# Patient Record
Sex: Female | Born: 1990 | Race: Black or African American | Hispanic: No | Marital: Single | Smoking: Never smoker
Health system: Southern US, Community
[De-identification: ages and names within clinical notes are randomized; demographics above are authoritative.]

## PROBLEM LIST (undated history)

## (undated) DIAGNOSIS — D571 Sickle-cell disease without crisis: Secondary | ICD-10-CM

---

## 2010-05-07 ENCOUNTER — Ambulatory Visit (HOSPITAL_COMMUNITY)
Admission: RE | Admit: 2010-05-07 | Discharge: 2010-05-07 | Disposition: A | Discharge status: Home / Self Care | Payer: Medicaid Other | Source: Ambulatory Visit | Attending: Oral and Maxillofacial Surgery | Admitting: Oral and Maxillofacial Surgery

## 2010-05-07 DIAGNOSIS — D571 Sickle-cell disease without crisis: Secondary | ICD-10-CM | POA: Insufficient documentation

## 2010-05-07 DIAGNOSIS — K006 Disturbances in tooth eruption: Secondary | ICD-10-CM | POA: Insufficient documentation

## 2010-05-07 DIAGNOSIS — K029 Dental caries, unspecified: Secondary | ICD-10-CM | POA: Insufficient documentation

## 2010-05-07 DIAGNOSIS — K047 Periapical abscess without sinus: Secondary | ICD-10-CM | POA: Insufficient documentation

## 2010-05-07 DIAGNOSIS — Z01812 Encounter for preprocedural laboratory examination: Secondary | ICD-10-CM | POA: Insufficient documentation

## 2010-05-07 LAB — BASIC METABOLIC PANEL
CO2: 20 mEq/L (ref 19–32)
Calcium: 9.3 mg/dL (ref 8.4–10.5)
Chloride: 110 mEq/L (ref 96–112)
Creatinine, Ser: 0.87 mg/dL (ref 0.4–1.2)
Glucose, Bld: 97 mg/dL (ref 70–99)
Sodium: 139 mEq/L (ref 135–145)

## 2010-05-07 LAB — CBC
HCT: 28 % — ABNORMAL LOW (ref 36.0–46.0)
Platelets: 161 10*3/uL (ref 150–400)

## 2010-05-07 LAB — PROTIME-INR: Prothrombin Time: 14 seconds (ref 11.6–15.2)

## 2010-05-11 NOTE — Discharge Summary (Signed)
  Stacy [name], Stacy [name]               ACCOUNT NO.:  [id number]  MEDICAL RECORD NO.:  [id number]           PATIENT TYPE:  O  LOCATION:  [facility]                         FACILITY:  [facility]  PHYSICIAN:  Lincoln Brigham, DDSDATE OF BIRTH:  [birth year]  DATE OF ADMISSION:  05/07/2010 DATE OF DISCHARGE:  05/07/2010                              DISCHARGE SUMMARY   SURGEON:  Lincoln Brigham, DDS  HISTORY OF PRESENT ILLNESS:  Stacy [name] is a 20 year old African American female that was referred for Dr. Elisha Headland, for extraction of several teeth.  The patient has a medical history significant for sickle cell disease.  The patient's last crisis was 4 months prior at the time of childbirth.  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  periapical abscess #19 and nonfunctional soft tissue impacted #17 and 32.  PLAN:  The patient was taken to the operating room for extraction of # 1,2,3,5,14,16, 17,18, 19, 30, and 32 along with alveoloplasty in the upper right maxilla, left mandible and right mandible.  The patient tolerated the procedure well without complications.  At the conclusion of the surgery, the patient was deemed stable to be discharged home. The patient was discharged home with followup in the office for the following week.  At consultation appointment, the patient was prescribed Percocet 10/325 for pain control, she was given 30 tablets and the patient was instructed to take  tablet every 4 hours as needed for pain.          ______________________________ Lincoln Brigham, DDS     CD/MEDQ  D:  05/09/2010  T:  05/09/2010  Job:  [id number]  Electronically Signed by Lincoln Brigham DDS on 05/11/2010 03:26:27 PM

## 2010-05-11 NOTE — Op Note (Signed)
[name], [name]               ACCOUNT NO.:  [id number]  MEDICAL RECORD NO.:  [id number]           PATIENT TYPE:  O  LOCATION:  [facility]                         FACILITY:  [facility]  PHYSICIAN:  Lincoln Brigham, DDSDATE OF BIRTH:  [birth year]  DATE OF PROCEDURE:  05/07/2010 DATE OF DISCHARGE:  05/07/2010                              OPERATIVE REPORT   PREOPERATIVE DIAGNOSIS:  Sickle cell anemia, full bony impacted #1 and #16, carious teeth numbers 2, 3, 18, 19, root tips teeth numbers 5 and 14, periapical abscess #19, nonfunctional 17 and 32.  POSTOPERATIVE DIAGNOSIS:  Sickle cell anemia, full bony impacted teeth numbers 1 and 16, carious teeth numbers 2, 3, 18, 19, and 30, root tips teeth numbers 5 and 14, periapical abscess #19, and nonfunctional 17 and 32 which were soft tissue impacted.  SURGEON:  Lincoln Brigham, DDS  INDICATIONS FOR SURGERY:  Stacy [name] is a 20 year old African American female originally presenting to my office as a referral from Dr. Elisha Headland for extraction of several teeth.  The patient has a significant medical history of sickle cell anemia with her last crisis being 4 months prior, at the time a childbirth.  The patient was evaluated and deemed necessary to go to the operating room for management of her sickle cell anemia and extraction of necessary teeth including teeth numbers 1, 2, 3, 5, 14, 16, 17, 18, 19, 30, and 32, along with alveoloplasty of the bone in the right maxilla, left mandible, and right mandible.  PROCEDURE:  Extraction of #1, 2, 3, 5, 14, 16, 17, 18, 19, 30, and 32 along with alveoloplasty in right maxillary, left mandibular, and right mandibular quadrants.  TECHNIQUE:  The patient was evaluated in the preoperative area.  The patient's consent was verified and updated.  The patient's history and physical was also updated and verified.  The patient was then taken to the operating room by general anesthesia and placed on the bed in  the supine position.  The patient was then intubated with a nasal tube in the normal fashion by Anesthesia and then turned over to myself.  At this point, the patient was prepped and draped in the usual sterile fashion for oral surgery procedures.  After time-out was performed, a moistened Ray-Tec was then placed in the patient's mouth as a throat pack and then local anesthesia was then infiltrated into the regions in the upper right, upper left, lower left, and lower right maxilla and mandible.  Approximately 7.2 mL of 0.5% Marcaine with 1:200,000 epinephrine was then used along with 10.8 mL of 2% lidocaine with 1:100,000 epinephrine to anesthetize the maxilla and the mandible in the region of tooth extraction.  At this point, tongue retractor was placed and a [place] was used to retract cheek and 15 blade was used to make a full-thickness mucoperiosteal incision with distal buccal release in the region of #17 to #19.  Teeth numbers 17, 18, and 19 were elevated and then removed with dental forceps.  Next, a rongeur was then used to smooth the bone along with the bone file.  Copious irrigation was used and then the area  was closed with 3-0 chromic gut suture.  This was repeated for teeth numbers 1, 2, 3, 5, 14, 16, 30, and 32.  A 3-0 chromic gut suture was used to close the upper right, upper left, and lower right quadrants.  Hemostasis was achieved.  The patient's mouth was irrigated with copious amounts of normal saline at the conclusion of the case.  All retractors were removed along with the [facility] and Ray-Tec which served as a throat pack.  The patient was then turned over to the care of Anesthesia.  All counts were correct at the conclusion of the case.  The patient was extubated in a stable fashion and taken to the postoperative area where she recovered in a satisfactory fashion.  COMPLICATIONS:  None.  ESTIMATED BLOOD LOSS:  Minimal.  SPECIMENS:  Teeth were discarded and not  sent to Pathology.  The patient's condition following the surgery was stable.          ______________________________ Lincoln Brigham, DDS     CD/MEDQ  D:  05/09/2010  T:  05/09/2010  Job:  [id number]  Electronically Signed by Lincoln Brigham DDS on 05/11/2010 03:26:25 PM

## 2011-09-19 ENCOUNTER — Encounter (HOSPITAL_BASED_OUTPATIENT_CLINIC_OR_DEPARTMENT_OTHER): Payer: Self-pay | Admitting: *Deleted

## 2011-09-19 ENCOUNTER — Emergency Department (HOSPITAL_BASED_OUTPATIENT_CLINIC_OR_DEPARTMENT_OTHER)
Admission: EM | Admit: 2011-09-19 | Discharge: 2011-09-20 | Disposition: A | Discharge status: Home / Self Care | Payer: Medicaid Other | Attending: Emergency Medicine | Admitting: Emergency Medicine

## 2011-09-19 DIAGNOSIS — IMO0001 Reserved for inherently not codable concepts without codable children: Secondary | ICD-10-CM | POA: Insufficient documentation

## 2011-09-19 DIAGNOSIS — R070 Pain in throat: Secondary | ICD-10-CM | POA: Insufficient documentation

## 2011-09-19 DIAGNOSIS — J029 Acute pharyngitis, unspecified: Secondary | ICD-10-CM

## 2011-09-19 HISTORY — DX: Sickle-cell disease without crisis: D57.1

## 2011-09-19 NOTE — ED Notes (Signed)
Pt reports a sore throat and generalized body aches. Pt reports that she was given an RX for antibiotics yesterday day and has no relief as of today.  Throat does not appear red or swollen.  Pt denies any cough or fever.

## 2011-09-19 NOTE — ED Notes (Signed)
Pt has hx of sickle cell and has had generalized body aches and sore throat for 3 days. Seen at [facility] last p.m. "and they didn't do anything"

## 2011-09-20 MED ORDER — NAPROXEN 250 MG PO TABS
500.0000 mg | ORAL_TABLET | Freq: Once | ORAL | Status: AC
  Administered 2011-09-20: 500 mg via ORAL
  Filled 2011-09-20: qty 2

## 2011-09-20 MED ORDER — HYDROCODONE-ACETAMINOPHEN 7.5-500 MG/15ML PO SOLN
10.0000 mL | Freq: Once | ORAL | Status: AC
  Administered 2011-09-20: 10 mL via ORAL
  Filled 2011-09-20: qty 15

## 2011-09-20 MED ORDER — HYDROCODONE-ACETAMINOPHEN 7.5-500 MG/15ML PO SOLN: 10.0000 mL | ORAL | Status: AC | PRN

## 2011-09-20 MED ORDER — DEXAMETHASONE 10 MG/ML FOR PEDIATRIC ORAL USE
10.0000 mg | Freq: Once | INTRAMUSCULAR | Status: DC
  Filled 2011-09-20: qty 1

## 2011-09-20 NOTE — Discharge Instructions (Signed)

## 2011-09-20 NOTE — ED Provider Notes (Signed)
History   This chart was scribed for Stacy Seamen, MD by Charolett Bumpers . The patient was seen in room MH04/MH04.    CSN: [id number]  Arrival date & time 09/19/11  2315   First MD Initiated Contact with Patient 09/20/11 0002      Chief Complaint  Patient presents with  . Generalized Body Aches    (Consider location/radiation/quality/duration/timing/severity/associated sxs/prior treatment) HPI Stacy [name] is a 21 y.o. female who presents to the Emergency Department complaining of constant, moderate generalized body aches with associated sore throat for the past 3 days. Patient rates her body aches as a 10/10 and her sore throat pain an 8/10. Patient also reports associated trouble swallowing. Patient denies any fevers, n/v, or urinary symptoms. Patient was seen at [facility] last night and started on Amoxicillin. Patient report no improvement in her symptoms.     Past Medical History  Diagnosis Date  . Sickle cell anemia     Past Surgical History  Procedure Date  . Cesarean section     History reviewed. No pertinent family history.  History  Substance Use Topics  . Smoking status: Never Smoker   . Smokeless tobacco: Not on file  . Alcohol Use: No    OB History    Grav Para Term Preterm Abortions TAB SAB Ect Mult Living                  Review of Systems  Constitutional: Negative for fever.  HENT: Positive for sore throat and trouble swallowing.   Gastrointestinal: Negative for nausea and vomiting.  Genitourinary: Negative for dysuria.  Musculoskeletal: Positive for myalgias.  All other systems reviewed and are negative.    Allergies  Review of patient's allergies indicates not on file.  Home Medications  No current outpatient prescriptions on file.  BP 123/80  Pulse 74  Temp 98.3 F (36.8 C) (Oral)  Resp 20  Ht 5' (1.524 m)  Wt 165 lb (74.844 kg)  BMI 32.22 kg/m2  SpO2 99%  Physical Exam  Nursing note and vitals  reviewed. Constitutional: She is oriented to person, place, and time. She appears well-developed and well-nourished. No distress.  HENT:  Head: Normocephalic and atraumatic.  Mouth/Throat: No oropharyngeal exudate.       No erythema or exudate of oropharynx.   Eyes: Conjunctivae and EOM are normal. Pupils are equal, round, and reactive to light.  Neck: Neck supple. No tracheal deviation present.       Anterior cervical adenopathy.   Cardiovascular: Normal rate, regular rhythm, normal heart sounds and intact distal pulses.        Good pulses.   Pulmonary/Chest: Effort normal and breath sounds normal. No respiratory distress. She has no wheezes.  Abdominal: Soft. Bowel sounds are normal. She exhibits no distension. There is no hepatosplenomegaly. There is no tenderness.  Musculoskeletal: Normal range of motion. She exhibits no edema.  Lymphadenopathy:    She has cervical adenopathy.  Neurological: She is alert and oriented to person, place, and time. No sensory deficit.  Skin: Skin is warm and dry.  Psychiatric: She has a normal mood and affect. Her behavior is normal.    ED Course  Procedures (including critical care time)  DIAGNOSTIC STUDIES: Oxygen Saturation is 99% on room air, normal by my interpretation.    COORDINATION OF CARE:  0006: Discussed planned course of treatment with the patient, who is agreeable at this time.  0015: Medication Orders: Hydrocodone-acetaminophen (Lortab) 7.5-500 mg/15  mL solution 10 mL-once; Naproxen (Naproxyn) tablet 500 mg-once.      MDM   Nursing notes and vitals signs, including pulse oximetry, reviewed.  Summary of this visit's results, reviewed by myself:  Labs:  Results for orders placed during the hospital encounter of 09/19/11  RAPID STREP SCREEN      Component Value Range   Streptococcus, Group A Screen (Direct) NEGATIVE  NEGATIVE     I personally performed the services described in this documentation, which was scribed in my  presence.  The recorded information has been reviewed and considered.       Stacy Seamen, MD 09/20/11 [phone]

## 2011-11-21 ENCOUNTER — Emergency Department (HOSPITAL_BASED_OUTPATIENT_CLINIC_OR_DEPARTMENT_OTHER)
Admission: EM | Admit: 2011-11-21 | Discharge: 2011-11-22 | Disposition: A | Discharge status: Home / Self Care | Payer: Medicaid Other | Attending: Emergency Medicine | Admitting: Emergency Medicine

## 2011-11-21 ENCOUNTER — Emergency Department (HOSPITAL_BASED_OUTPATIENT_CLINIC_OR_DEPARTMENT_OTHER): Payer: Medicaid Other

## 2011-11-21 ENCOUNTER — Encounter (HOSPITAL_BASED_OUTPATIENT_CLINIC_OR_DEPARTMENT_OTHER): Payer: Self-pay | Admitting: *Deleted

## 2011-11-21 DIAGNOSIS — S93409A Sprain of unspecified ligament of unspecified ankle, initial encounter: Secondary | ICD-10-CM | POA: Insufficient documentation

## 2011-11-21 DIAGNOSIS — M25569 Pain in unspecified knee: Secondary | ICD-10-CM | POA: Insufficient documentation

## 2011-11-21 DIAGNOSIS — Y92009 Unspecified place in unspecified non-institutional (private) residence as the place of occurrence of the external cause: Secondary | ICD-10-CM | POA: Insufficient documentation

## 2011-11-21 DIAGNOSIS — W010XXA Fall on same level from slipping, tripping and stumbling without subsequent striking against object, initial encounter: Secondary | ICD-10-CM | POA: Insufficient documentation

## 2011-11-21 NOTE — ED Notes (Signed)
Pt states she slipped in the tub and injured her right leg and ankle. C/O pain to same

## 2011-11-22 ENCOUNTER — Emergency Department (HOSPITAL_BASED_OUTPATIENT_CLINIC_OR_DEPARTMENT_OTHER): Payer: Medicaid Other

## 2011-11-22 MED ORDER — IBUPROFEN 600 MG PO TABS: 600.0000 mg | ORAL_TABLET | Freq: Three times a day (TID) | ORAL | Status: AC | PRN

## 2011-11-22 MED ORDER — IBUPROFEN 400 MG PO TABS
600.0000 mg | ORAL_TABLET | Freq: Once | ORAL | Status: AC
  Administered 2011-11-22: 600 mg via ORAL
  Filled 2011-11-22: qty 1

## 2011-11-22 NOTE — ED Provider Notes (Signed)
History    CSN: [id number] Arrival date & time 11/21/11  2314 First MD Initiated Contact with Patient 11/21/11 2356      Chief Complaint  Patient presents with  . Leg Pain   Patient is a 21 y.o. female presenting with leg pain.  Leg Pain   Patient presents emergent complaints of a right leg injury.  Patient states she slipped in the and injured her right lower leg. She now has pain from her knee down to her ankle. Pain is mild in nature but increases when she walks or tries to move her foot and ankle. She denies any numbness or weakness. She denies any loss of consciousness or any other injury Past Medical History  Diagnosis Date  . Sickle cell anemia     Past Surgical History  Procedure Date  . Cesarean section     History reviewed. No pertinent family history.  History  Substance Use Topics  . Smoking status: Never Smoker   . Smokeless tobacco: Not on file  . Alcohol Use: No    OB History    Grav Para Term Preterm Abortions TAB SAB Ect Mult Living                  Review of Systems  All other systems reviewed and are negative.    Allergies  Demerol  Home Medications   Current Outpatient Rx  Name Route Sig Dispense Refill  . AMOXICILLIN 500 MG PO CAPS Oral Take 500 mg by mouth 3 (three) times daily.      BP 135/84  Pulse 72  Temp 98.9 F (37.2 C) (Oral)  Resp 20  Ht 5' (1.524 m)  Wt 165 lb (74.844 kg)  BMI 32.22 kg/m2  SpO2 98%  Physical Exam  Nursing note and vitals reviewed. Constitutional: She appears well-developed and well-nourished. No distress.  HENT:  Head: Normocephalic and atraumatic.  Right Ear: External ear normal.  Left Ear: External ear normal.  Eyes: Conjunctivae are normal. Right eye exhibits no discharge. Left eye exhibits no discharge. No scleral icterus.  Neck: Neck supple. No tracheal deviation present.  Cardiovascular: Normal rate.   Pulmonary/Chest: Effort normal. No stridor. No respiratory distress.  Musculoskeletal:  She exhibits tenderness. She exhibits no edema.       Right knee: She exhibits no swelling and no effusion.       Left knee: She exhibits no swelling and no effusion.       Right lower leg: She exhibits tenderness. She exhibits no swelling, no edema and no deformity.       Right foot: She exhibits tenderness. She exhibits normal range of motion, no bony tenderness, no swelling and normal capillary refill.  Neurological: She is alert. Cranial nerve deficit: no gross deficits.  Skin: Skin is warm and dry. No rash noted.  Psychiatric: She has a normal mood and affect.    ED Course  Procedures (including critical care time) Pt is on depo.  Does not think she could be pregnant.  Labs Reviewed - No data to display Dg Tibia/fibula Right  11/22/2011  *RADIOLOGY REPORT*  Clinical Data: Fall, pain.  RIGHT TIBIA AND FIBULA - 2 VIEW  Comparison: None.  Findings: Imaged bones, joints and soft tissues appear normal.  IMPRESSION: Negative exam.   Original Report Authenticated By: Bernadene Bell. D'ALESSIO, M.D.    Dg Ankle Complete Right  11/22/2011  *RADIOLOGY REPORT*  Clinical Data: Fall, pain.  RIGHT ANKLE - COMPLETE 3+ VIEW  Comparison: None.  Findings: Imaged bones, joints and soft tissues appear normal.  IMPRESSION: Negative exam.   Original Report Authenticated By: Bernadene Bell. D'ALESSIO, M.D.    Dg Knee Complete 4 Views Right  11/22/2011  *RADIOLOGY REPORT*  Clinical Data: Fall.  Knee pain.  RIGHT KNEE - COMPLETE 4+ VIEW  Comparison: None.  Findings: Imaged bones, joints and soft tissues appear normal.  IMPRESSION: Negative exam.   Original Report Authenticated By: Bernadene Bell. D'ALESSIO, M.D.      1. Ankle sprain       MDM  At this time there does not appear to be any evidence of an acute emergency medical condition and the patient appears stable for discharge with appropriate outpatient follow up.         Celene Kras, MD 11/22/11 [phone]

## 2011-11-22 NOTE — ED Notes (Signed)
Returned from xray

## 2011-11-22 NOTE — ED Notes (Addendum)
Assisted pt to her car.

## 2011-11-22 NOTE — ED Notes (Signed)
Patient transported to X-ray 

## 2012-02-11 ENCOUNTER — Emergency Department (HOSPITAL_BASED_OUTPATIENT_CLINIC_OR_DEPARTMENT_OTHER)
Admission: EM | Admit: 2012-02-11 | Discharge: 2012-02-12 | Disposition: A | Discharge status: Home / Self Care | Payer: No Typology Code available for payment source | Attending: Emergency Medicine | Admitting: Emergency Medicine

## 2012-02-11 ENCOUNTER — Encounter (HOSPITAL_BASED_OUTPATIENT_CLINIC_OR_DEPARTMENT_OTHER): Payer: Self-pay

## 2012-02-11 DIAGNOSIS — Y929 Unspecified place or not applicable: Secondary | ICD-10-CM | POA: Insufficient documentation

## 2012-02-11 DIAGNOSIS — D571 Sickle-cell disease without crisis: Secondary | ICD-10-CM | POA: Insufficient documentation

## 2012-02-11 DIAGNOSIS — Y939 Activity, unspecified: Secondary | ICD-10-CM | POA: Insufficient documentation

## 2012-02-11 DIAGNOSIS — S139XXA Sprain of joints and ligaments of unspecified parts of neck, initial encounter: Secondary | ICD-10-CM | POA: Insufficient documentation

## 2012-02-11 DIAGNOSIS — S161XXA Strain of muscle, fascia and tendon at neck level, initial encounter: Secondary | ICD-10-CM
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
## 2012-02-12 ENCOUNTER — Emergency Department (HOSPITAL_BASED_OUTPATIENT_CLINIC_OR_DEPARTMENT_OTHER): Payer: No Typology Code available for payment source

## 2012-02-12 MED ORDER — HYDROCODONE-ACETAMINOPHEN 5-325 MG PO TABS: 1.0000 | ORAL_TABLET | Freq: Four times a day (QID) | ORAL | Status: DC | PRN

## 2012-02-12 NOTE — ED Notes (Signed)
Patient transported to X-ray 

## 2012-02-12 NOTE — ED Notes (Signed)
Patient transported to CT 

## 2012-06-29 ENCOUNTER — Emergency Department (HOSPITAL_BASED_OUTPATIENT_CLINIC_OR_DEPARTMENT_OTHER)
Admission: EM | Admit: 2012-06-29 | Discharge: 2012-06-29 | Disposition: A | Discharge status: Home / Self Care | Payer: Medicaid Other | Attending: Emergency Medicine | Admitting: Emergency Medicine

## 2012-06-29 ENCOUNTER — Encounter (HOSPITAL_BASED_OUTPATIENT_CLINIC_OR_DEPARTMENT_OTHER): Payer: Self-pay | Admitting: Emergency Medicine

## 2012-06-29 DIAGNOSIS — N390 Urinary tract infection, site not specified: Secondary | ICD-10-CM

## 2012-06-29 DIAGNOSIS — Z862 Personal history of diseases of the blood and blood-forming organs and certain disorders involving the immune mechanism: Secondary | ICD-10-CM | POA: Insufficient documentation

## 2012-06-29 DIAGNOSIS — M545 Low back pain, unspecified: Secondary | ICD-10-CM | POA: Insufficient documentation

## 2012-06-29 DIAGNOSIS — Z3202 Encounter for pregnancy test, result negative: Secondary | ICD-10-CM | POA: Insufficient documentation

## 2012-06-29 LAB — PREGNANCY, URINE: Preg Test, Ur: NEGATIVE

## 2012-06-29 LAB — URINE MICROSCOPIC-ADD ON

## 2012-06-29 LAB — URINALYSIS, ROUTINE W REFLEX MICROSCOPIC
Bilirubin Urine: NEGATIVE
Glucose, UA: NEGATIVE mg/dL
Specific Gravity, Urine: 1.013 (ref 1.005–1.030)
Urobilinogen, UA: 0.2 mg/dL (ref 0.0–1.0)

## 2012-06-29 MED ORDER — HYDROCODONE-ACETAMINOPHEN 5-325 MG PO TABS: 2.0000 | ORAL_TABLET | ORAL | Status: DC | PRN

## 2012-06-29 MED ORDER — CEPHALEXIN 500 MG PO CAPS: 500.0000 mg | ORAL_CAPSULE | Freq: Four times a day (QID) | ORAL | Status: DC

## 2012-06-29 NOTE — ED Notes (Signed)
MD at bedside. 

## 2012-06-29 NOTE — ED Provider Notes (Signed)
History     CSN: [id number]  Arrival date & time 06/29/12  1018   First MD Initiated Contact with Patient 06/29/12 1210      Chief Complaint  Patient presents with  . Abdominal Pain  . Back Pain    (Consider location/radiation/quality/duration/timing/severity/associated sxs/prior treatment) Patient is a 22 y.o. female presenting with abdominal pain and back pain. The history is provided by the patient. No language interpreter was used.  Abdominal Pain Pain location:  Generalized Pain quality: aching   Pain radiates to:  Back Pain severity:  Moderate Onset quality:  Gradual Duration:  1 day Timing:  Constant Progression:  Worsening Chronicity:  New Relieved by:  Nothing Worsened by:  Nothing tried Risk factors: no alcohol abuse   Back Pain Associated symptoms: abdominal pain   Pt complains of soreness in her low back and lower abdominal soreness.  No vomiting of diarrhea.  No fever  Past Medical History  Diagnosis Date  . Sickle cell anemia     Past Surgical History  Procedure Laterality Date  . Cesarean section      No family history on file.  History  Substance Use Topics  . Smoking status: Never Smoker   . Smokeless tobacco: Not on file  . Alcohol Use: No    OB History   Grav Para Term Preterm Abortions TAB SAB Ect Mult Living                  Review of Systems  Gastrointestinal: Positive for abdominal pain.  Musculoskeletal: Positive for back pain.  All other systems reviewed and are negative.    Allergies  Demerol  Home Medications   Current Outpatient Rx  Name  Route  Sig  Dispense  Refill  . cephALEXin (KEFLEX) 500 MG capsule   Oral   Take 1 capsule (500 mg total) by mouth 4 (four) times daily.   40 capsule   0   . HYDROcodone-acetaminophen (NORCO/VICODIN) 5-325 MG per tablet   Oral   Take 1-2 tablets by mouth every 6 (six) hours as needed for pain.   20 tablet   0   . HYDROcodone-acetaminophen (NORCO/VICODIN) 5-325 MG per  tablet   Oral   Take 2 tablets by mouth every 4 (four) hours as needed for pain.   10 tablet   0     BP 138/94  Pulse 106  Temp(Src) 98.6 F (37 C) (Oral)  Resp 16  Ht 5' (1.524 m)  Wt 160 lb (72.576 kg)  BMI 31.25 kg/m2  SpO2 97%  LMP 06/05/2012  Physical Exam  Nursing note and vitals reviewed. Constitutional: She is oriented to person, place, and time. She appears well-developed and well-nourished.  HENT:  Head: Normocephalic.  Right Ear: External ear normal.  Mouth/Throat: Oropharynx is clear and moist.  Eyes: Conjunctivae are normal. Pupils are equal, round, and reactive to light.  Neck: Normal range of motion. Neck supple.  Cardiovascular: Normal rate and normal heart sounds.   Pulmonary/Chest: Effort normal.  Abdominal: Soft.  Musculoskeletal: Normal range of motion.  Neurological: She is alert and oriented to person, place, and time. She has normal reflexes.  Skin: Skin is warm.  Psychiatric: She has a normal mood and affect.    ED Course  Procedures (including critical care time)  Labs Reviewed  URINALYSIS, ROUTINE W REFLEX MICROSCOPIC - Abnormal; Notable for the following:    APPearance TURBID (*)    Hgb urine dipstick LARGE (*)    Protein, ur  100 (*)    Nitrite POSITIVE (*)    Leukocytes, UA LARGE (*)    All other components within normal limits  URINE MICROSCOPIC-ADD ON - Abnormal; Notable for the following:    Bacteria, UA MANY (*)    All other components within normal limits  URINE CULTURE  PREGNANCY, URINE   No results found.   1. UTI (lower urinary tract infection)       MDM  Pt given rx for keflex and hydrocodone.   Pt advised to return if any problems.       Lonia Skinner [place], PA-C 06/29/12 1415  Elson Areas, PA-C 06/29/12 1415

## 2012-06-29 NOTE — ED Notes (Signed)
C/o abd pain and low back pain since last night.  N/V x one last night.  No diarrhea.  No fever. Has taken IBU without relief.  Last dose last night.

## 2012-06-29 NOTE — ED Provider Notes (Signed)
Medical screening examination/treatment/procedure(s) were performed by non-physician practitioner and as supervising physician I was immediately available for consultation/collaboration.   Carleene Cooper III, MD 06/29/12 2037

## 2012-07-01 LAB — URINE CULTURE: Colony Count: >=100000

## 2012-07-02 ENCOUNTER — Telehealth (HOSPITAL_COMMUNITY): Payer: Self-pay | Admitting: Emergency Medicine

## 2012-07-02 NOTE — ED Notes (Signed)
Patient has +Urine culture. Checking to see if appropriately treated. °

## 2012-07-02 NOTE — ED Notes (Signed)
+  Urine. Patient treated with Keflex. Sensitive to same. Per protocol MD. °

## 2013-01-02 ENCOUNTER — Emergency Department (HOSPITAL_BASED_OUTPATIENT_CLINIC_OR_DEPARTMENT_OTHER)
Admission: EM | Admit: 2013-01-02 | Discharge: 2013-01-02 | Disposition: A | Discharge status: Home / Self Care | Payer: Medicaid Other | Attending: Emergency Medicine | Admitting: Emergency Medicine

## 2013-01-02 ENCOUNTER — Encounter (HOSPITAL_BASED_OUTPATIENT_CLINIC_OR_DEPARTMENT_OTHER): Payer: Self-pay | Admitting: *Deleted

## 2013-01-02 ENCOUNTER — Emergency Department (HOSPITAL_BASED_OUTPATIENT_CLINIC_OR_DEPARTMENT_OTHER): Payer: Medicaid Other

## 2013-01-02 DIAGNOSIS — Z792 Long term (current) use of antibiotics: Secondary | ICD-10-CM | POA: Insufficient documentation

## 2013-01-02 DIAGNOSIS — IMO0001 Reserved for inherently not codable concepts without codable children: Secondary | ICD-10-CM | POA: Insufficient documentation

## 2013-01-02 DIAGNOSIS — J029 Acute pharyngitis, unspecified: Secondary | ICD-10-CM | POA: Insufficient documentation

## 2013-01-02 DIAGNOSIS — Z3202 Encounter for pregnancy test, result negative: Secondary | ICD-10-CM | POA: Insufficient documentation

## 2013-01-02 DIAGNOSIS — R599 Enlarged lymph nodes, unspecified: Secondary | ICD-10-CM | POA: Insufficient documentation

## 2013-01-02 DIAGNOSIS — D5700 Hb-SS disease with crisis, unspecified: Secondary | ICD-10-CM

## 2013-01-02 LAB — CBC WITH DIFFERENTIAL/PLATELET
Basophils Absolute: 0 10*3/uL (ref 0.0–0.1)
Eosinophils Absolute: 0.1 10*3/uL (ref 0.0–0.7)
HCT: 31.2 % — ABNORMAL LOW (ref 36.0–46.0)
Lymphocytes Relative: 16 % (ref 12–46)
MCHC: 37.2 g/dL — ABNORMAL HIGH (ref 30.0–36.0)
Monocytes Absolute: 1.1 10*3/uL — ABNORMAL HIGH (ref 0.1–1.0)
Monocytes Relative: 8 % (ref 3–12)
Neutro Abs: 10 10*3/uL — ABNORMAL HIGH (ref 1.7–7.7)
Neutrophils Relative %: 75 % (ref 43–77)
Platelets: 255 10*3/uL (ref 150–400)
RDW: 16 % — ABNORMAL HIGH (ref 11.5–15.5)

## 2013-01-02 LAB — BASIC METABOLIC PANEL
BUN: 10 mg/dL (ref 6–23)
CO2: 23 mEq/L (ref 19–32)
Chloride: 102 mEq/L (ref 96–112)
Creatinine, Ser: 0.8 mg/dL (ref 0.50–1.10)
GFR calc Af Amer: >90 mL/min (ref >90)
GFR calc non Af Amer: >90 mL/min (ref >90)
Potassium: 3.9 mEq/L (ref 3.5–5.1)

## 2013-01-02 LAB — URINALYSIS, ROUTINE W REFLEX MICROSCOPIC
Bilirubin Urine: NEGATIVE
Hgb urine dipstick: NEGATIVE
Ketones, ur: NEGATIVE mg/dL
Leukocytes, UA: NEGATIVE
Nitrite: NEGATIVE
Urobilinogen, UA: 0.2 mg/dL (ref 0.0–1.0)
pH: 7 (ref 5.0–8.0)

## 2013-01-02 LAB — RETICULOCYTES: RBC.: 4.39 MIL/uL (ref 3.87–5.11)

## 2013-01-02 LAB — RAPID STREP SCREEN (MED CTR MEBANE ONLY): Streptococcus, Group A Screen (Direct): NEGATIVE

## 2013-01-02 MED ORDER — HYDROCODONE-ACETAMINOPHEN 5-325 MG PO TABS: 2.0000 | ORAL_TABLET | Freq: Four times a day (QID) | ORAL | Status: DC | PRN

## 2013-01-02 MED ORDER — PENICILLIN G BENZATHINE 1200000 UNIT/2ML IM SUSP
1.2000 10*6.[IU] | Freq: Once | INTRAMUSCULAR | Status: AC
  Administered 2013-01-02: 1.2 10*6.[IU] via INTRAMUSCULAR
  Filled 2013-01-02: qty 2

## 2013-01-02 MED ORDER — SODIUM CHLORIDE 0.9 % IV BOLUS (SEPSIS)
1000.0000 mL | Freq: Once | INTRAVENOUS | Status: AC
  Administered 2013-01-02: 1000 mL via INTRAVENOUS

## 2013-01-02 MED ORDER — HYDROMORPHONE HCL PF 1 MG/ML IJ SOLN
1.0000 mg | Freq: Once | INTRAMUSCULAR | Status: AC
  Administered 2013-01-02: 1 mg via INTRAVENOUS
  Filled 2013-01-02: qty 1

## 2013-01-02 MED ORDER — IOHEXOL 300 MG/ML  SOLN
75.0000 mL | Freq: Once | INTRAMUSCULAR | Status: AC | PRN
  Administered 2013-01-02: 75 mL via INTRAVENOUS

## 2013-01-02 MED ORDER — DEXAMETHASONE SODIUM PHOSPHATE 10 MG/ML IJ SOLN
10.0000 mg | Freq: Once | INTRAMUSCULAR | Status: AC
  Administered 2013-01-02: 10 mg via INTRAVENOUS
  Filled 2013-01-02: qty 1

## 2013-01-02 MED ORDER — OXYCODONE-ACETAMINOPHEN 5-325 MG PO TABS
2.0000 | ORAL_TABLET | Freq: Once | ORAL | Status: AC
  Administered 2013-01-02: 2 via ORAL
  Filled 2013-01-02: qty 2

## 2013-01-02 NOTE — ED Notes (Signed)
MD at bedside. 

## 2013-01-02 NOTE — ED Notes (Signed)
Pt c/o sore throat and body aches x 3 days

## 2013-01-02 NOTE — ED Provider Notes (Signed)
CSN: [id number]     Arrival date & time 01/02/13  1739 History  This chart was scribed for Orest Dygert B. Bernette Mayers, MD by Dorothey Baseman, ED Scribe. This patient was seen in room MH05/MH05 and the patient's care was started at 8:05 PM.    Chief Complaint  Patient presents with  . Sore Throat  . Generalized Body Aches   The history is provided by the patient. No language interpreter was used.   HPI Comments: Stacy [name] is a 22 y.o. female who presents to the Emergency Department complaining of sore throat with associated myalgias onset 3 days ago. Patient reports that her myalgias feel similar to her sickle cell anemia pain. She reports taking ibuprofen at home with mild, temporary relief. Patient states that she has not followed up with anyone for her sickle cell recently. She denies cough, rhinorrhea, ear pain, fever, or trouble swallowing. Patient denies any sick contacts.   Past Medical History  Diagnosis Date  . Sickle cell anemia    Past Surgical History  Procedure Laterality Date  . Cesarean section     History reviewed. No pertinent family history. History  Substance Use Topics  . Smoking status: Never Smoker   . Smokeless tobacco: Not on file  . Alcohol Use: No   OB History   Grav Para Term Preterm Abortions TAB SAB Ect Mult Living                 Review of Systems  A complete 10 system review of systems was obtained and all systems are negative except as noted in the HPI and PMH.   Allergies  Demerol  Home Medications   Current Outpatient Rx  Name  Route  Sig  Dispense  Refill  . cephALEXin (KEFLEX) 500 MG capsule   Oral   Take 1 capsule (500 mg total) by mouth 4 (four) times daily.   40 capsule   0   . HYDROcodone-acetaminophen (NORCO/VICODIN) 5-325 MG per tablet   Oral   Take 1-2 tablets by mouth every 6 (six) hours as needed for pain.   20 tablet   0   . HYDROcodone-acetaminophen (NORCO/VICODIN) 5-325 MG per tablet   Oral   Take 2 tablets by mouth  every 4 (four) hours as needed for pain.   10 tablet   0    Triage Vitals: BP 117/72  Pulse 109  Temp(Src) 99.1 F (37.3 C) (Oral)  Resp 16  Ht 5' (1.524 m)  Wt 160 lb (72.576 kg)  BMI 31.25 kg/m2  SpO2 99%  LMP 12/26/2012  Physical Exam  Constitutional: She is oriented to person, place, and time. She appears well-developed and well-nourished.  HENT:  Head: Normocephalic and atraumatic.  Mouth/Throat: Oropharynx is clear and moist.  Right-sided tonsillar swelling 2+ includes tonsillar pillar.   Neck: Neck supple.  Pulmonary/Chest: Effort normal.  Abdominal: Soft. There is no tenderness.  Lymphadenopathy:    She has cervical adenopathy.  Neurological: She is alert and oriented to person, place, and time. No cranial nerve deficit.  Psychiatric: She has a normal mood and affect. Her behavior is normal.    ED Course  Procedures (including critical care time)  Medications  sodium chloride 0.9 % bolus 1,000 mL (0 mLs Intravenous Stopped 01/02/13 2134)  dexamethasone (DECADRON) injection 10 mg (10 mg Intravenous Given 01/02/13 2028)  iohexol (OMNIPAQUE) 300 MG/ML solution 75 mL (75 mLs Intravenous Contrast Given 01/02/13 2046)  HYDROmorphone (DILAUDID) injection 1 mg (1 mg Intravenous Given  01/02/13 2135)  penicillin g benzathine (BICILLIN LA) 1200000 UNIT/2ML injection 1.2 Million Units (1.2 Million Units Intramuscular Given 01/02/13 2139)    DIAGNOSTIC STUDIES: Oxygen Saturation is 99% on room air, normal by my interpretation.    COORDINATION OF CARE: 8:07PM- Discussed negative strep test. Will order IV fluids, antibiotics, and a CT of the neck. Discussed treatment plan with patient at bedside and patient verbalized agreement.     Labs Review Labs Reviewed  CBC WITH DIFFERENTIAL - Abnormal; Notable for the following:    WBC 13.3 (*)    Hemoglobin 11.6 (*)    HCT 31.2 (*)    MCV 72.2 (*)    MCHC 37.2 (*)    RDW 16.0 (*)    Neutro Abs 10.0 (*)    Monocytes Absolute 1.1  (*)    All other components within normal limits  BASIC METABOLIC PANEL - Abnormal; Notable for the following:    Glucose, Bld 101 (*)    All other components within normal limits  RETICULOCYTES - Abnormal; Notable for the following:    Retic Ct Pct 3.3 (*)    All other components within normal limits  RAPID STREP SCREEN  CULTURE, GROUP A STREP  URINALYSIS, ROUTINE W REFLEX MICROSCOPIC  PREGNANCY, URINE   Imaging Review Ct Soft Tissue Neck W Contrast   01/02/2013   CLINICAL DATA:  Sore throat and fever for 3 days. Rule out right peritonsillar abscess.  EXAM: CT NECK WITH CONTRAST  TECHNIQUE: Multidetector CT imaging of the neck was performed using the standard protocol following the bolus administration of intravenous contrast.  CONTRAST:  75mL OMNIPAQUE IOHEXOL 300 MG/ML  SOLN  COMPARISON:  02/12/2012 cervical spine CT.  FINDINGS: Enlarged inflamed palatine tonsils greater on the right. Presently, no well-defined drainable abscess or breakthrough into the parapharyngeal space.  Bilateral lymph nodes throughout the neck largest right level 2 region measures up to 2.2 x 2 cm transverse dimension. Lymph nodes are most likely reactive in origin given the clinical setting.  Appearing inferior to the left lobe of thyroid gland is a 2.3 x 2.3 x 1.5 cm low-density lesion. It is possible this represents a thymic cyst given the location and low density. For further delineation, contrast enhanced MRI of the neck on elective basis can be performed.  Reversal of the normal cervical lordosis.  Polypoid opacification the right maxillary sinus.  IMPRESSION: Enlarged inflamed palatine tonsils greater on the right. Presently, no well-defined drainable abscess or breakthrough into the parapharyngeal space.  Bilateral lymph nodes throughout the neck largest right level 2 region measures up to 2.2 x 2 cm transverse dimension. Lymph nodes are most likely reactive in origin given the clinical setting.  Appearing inferior  to the left lobe of thyroid gland is a 2.3 x 2.3 x 1.5 cm low-density lesion. It is possible this represents a thymic cyst given the location and low density. For further delineation, contrast enhanced MRI of the neck on elective basis can be performed.  Polypoid opacification the right maxillary sinus.   Electronically Signed   By: Bridgett Larsson M.D.   On: 01/02/2013 21:01    MDM   1. Pharyngitis   2. Sickle cell anemia with pain     Labs and imaging reviewed. Concern for pharyngitis, but no signs of abscess. She has low grade temp but no fever here. Feeling better after decadron, pain meds and IVF. Requesting one additional dose of pain medication, but otherwise feels ready to go home. Given PCN  IM for pharyngitis.  I personally performed the services described in this documentation, which was scribed in my presence. The recorded information has been reviewed and is accurate.      Jameis Newsham B. Bernette Mayers, MD 01/02/13 2223

## 2013-01-04 LAB — CULTURE, GROUP A STREP

## 2013-04-11 ENCOUNTER — Encounter (HOSPITAL_BASED_OUTPATIENT_CLINIC_OR_DEPARTMENT_OTHER): Payer: Self-pay | Admitting: Emergency Medicine

## 2013-04-11 ENCOUNTER — Emergency Department (HOSPITAL_BASED_OUTPATIENT_CLINIC_OR_DEPARTMENT_OTHER): Payer: Medicaid Other

## 2013-04-11 ENCOUNTER — Emergency Department (HOSPITAL_BASED_OUTPATIENT_CLINIC_OR_DEPARTMENT_OTHER)
Admission: EM | Admit: 2013-04-11 | Discharge: 2013-04-12 | Disposition: A | Discharge status: Home / Self Care | Payer: Medicaid Other | Attending: Emergency Medicine | Admitting: Emergency Medicine

## 2013-04-11 DIAGNOSIS — Z3202 Encounter for pregnancy test, result negative: Secondary | ICD-10-CM | POA: Insufficient documentation

## 2013-04-11 DIAGNOSIS — D5700 Hb-SS disease with crisis, unspecified: Secondary | ICD-10-CM

## 2013-04-11 DIAGNOSIS — Z792 Long term (current) use of antibiotics: Secondary | ICD-10-CM | POA: Insufficient documentation

## 2013-04-11 LAB — BASIC METABOLIC PANEL
BUN: 8 mg/dL (ref 6–23)
CALCIUM: 9.2 mg/dL (ref 8.4–10.5)
CO2: 26 mEq/L (ref 19–32)
Chloride: 106 mEq/L (ref 96–112)
Creatinine, Ser: 0.8 mg/dL (ref 0.50–1.10)
GFR calc Af Amer: >90 mL/min (ref >90)
GFR calc non Af Amer: >90 mL/min (ref >90)
Glucose, Bld: 89 mg/dL (ref 70–99)
Potassium: 3.8 mEq/L (ref 3.7–5.3)
SODIUM: 142 meq/L (ref 137–147)

## 2013-04-11 LAB — CBC
HCT: 29 % — ABNORMAL LOW (ref 36.0–46.0)
Hemoglobin: 10.8 g/dL — ABNORMAL LOW (ref 12.0–15.0)
MCH: 26.6 pg (ref 26.0–34.0)
MCHC: 37.2 g/dL — ABNORMAL HIGH (ref 30.0–36.0)
MCV: 71.4 fL — ABNORMAL LOW (ref 78.0–100.0)
PLATELETS: 245 10*3/uL (ref 150–400)
RBC: 4.06 MIL/uL (ref 3.87–5.11)
RDW: 16.4 % — ABNORMAL HIGH (ref 11.5–15.5)
WBC: 10.5 10*3/uL (ref 4.0–10.5)

## 2013-04-11 LAB — DIFFERENTIAL
Basophils Absolute: 0 10*3/uL (ref 0.0–0.1)
Basophils Relative: 0 % (ref 0–1)
Eosinophils Absolute: 0 10*3/uL (ref 0.0–0.7)
Eosinophils Relative: 0 % (ref 0–5)
LYMPHS PCT: 23 % (ref 12–46)
Lymphs Abs: 2.4 10*3/uL (ref 0.7–4.0)
MONO ABS: 0.8 10*3/uL (ref 0.1–1.0)
Monocytes Relative: 8 % (ref 3–12)
Neutro Abs: 7.3 10*3/uL (ref 1.7–7.7)
Neutrophils Relative %: 69 % (ref 43–77)

## 2013-04-11 LAB — PREGNANCY, URINE: Preg Test, Ur: NEGATIVE

## 2013-04-11 MED ORDER — ONDANSETRON HCL 4 MG/2ML IJ SOLN
4.0000 mg | Freq: Once | INTRAMUSCULAR | Status: AC
  Administered 2013-04-11: 4 mg via INTRAMUSCULAR
  Filled 2013-04-11: qty 2

## 2013-04-11 MED ORDER — HYDROMORPHONE HCL PF 2 MG/ML IJ SOLN
2.0000 mg | Freq: Once | INTRAMUSCULAR | Status: AC
  Administered 2013-04-11: 2 mg via INTRAMUSCULAR
  Filled 2013-04-11: qty 1

## 2013-04-11 NOTE — ED Provider Notes (Signed)
CSN: [id number]     Arrival date & time 04/11/13  2013 History   First MD Initiated Contact with Patient 04/11/13 2029     Chief Complaint  Patient presents with  . Leg Pain   (Consider location/radiation/quality/duration/timing/severity/associated sxs/prior Treatment) Patient is a 23 y.o. female presenting with leg pain. The history is provided by the patient. No language interpreter was used.  Leg Pain Location:  Hip Injury: no   Hip location:  L hip Pain details:    Quality:  Aching   Radiates to:  Does not radiate   Severity:  Moderate   Onset quality:  Gradual   Duration:  4 hours   Progression:  Worsening Chronicity:  New Dislocation: no   Foreign body present:  No foreign bodies Relieved by:  Nothing Worsened by:  Nothing tried Ineffective treatments:  None tried Risk factors: no concern for non-accidental trauma   Pt reports she has had pain in left hip and leg since 5p.   Pt has sickle cell.  No crisis recently  Past Medical History  Diagnosis Date  . Sickle cell anemia    Past Surgical History  Procedure Laterality Date  . Cesarean section     History reviewed. No pertinent family history. History  Substance Use Topics  . Smoking status: Never Smoker   . Smokeless tobacco: Not on file  . Alcohol Use: No   OB History   Grav Para Term Preterm Abortions TAB SAB Ect Mult Living                 Review of Systems  Musculoskeletal: Negative for joint swelling.  All other systems reviewed and are negative.    Allergies  Demerol  Home Medications   Current Outpatient Rx  Name  Route  Sig  Dispense  Refill  . amoxicillin (AMOXIL) 250 MG capsule   Oral   Take 250 mg by mouth 3 (three) times daily.         . cephALEXin (KEFLEX) 500 MG capsule   Oral   Take 1 capsule (500 mg total) by mouth 4 (four) times daily.   40 capsule   0   . HYDROcodone-acetaminophen (NORCO/VICODIN) 5-325 MG per tablet   Oral   Take 1-2 tablets by mouth every 6 (six)  hours as needed for pain.   20 tablet   0   . HYDROcodone-acetaminophen (NORCO/VICODIN) 5-325 MG per tablet   Oral   Take 2 tablets by mouth every 4 (four) hours as needed for pain.   10 tablet   0   . HYDROcodone-acetaminophen (NORCO/VICODIN) 5-325 MG per tablet   Oral   Take 2 tablets by mouth every 6 (six) hours as needed for pain.   30 tablet   0    BP 119/87  Pulse 100  Temp(Src) 98.8 F (37.1 C) (Oral)  Resp 18  Ht 5' (1.524 m)  Wt 155 lb (70.308 kg)  BMI 30.27 kg/m2  SpO2 100%  LMP 03/21/2013 Physical Exam  Nursing note and vitals reviewed. Constitutional: She is oriented to person, place, and time. She appears well-developed and well-nourished.  HENT:  Head: Normocephalic.  Right Ear: External ear normal.  Left Ear: External ear normal.  Eyes: Conjunctivae are normal. Pupils are equal, round, and reactive to light.  Cardiovascular: Normal rate.   Pulmonary/Chest: Effort normal.  Abdominal: Soft.  Musculoskeletal: She exhibits tenderness.  Neurological: She is alert and oriented to person, place, and time. She has normal reflexes.  Skin:  Skin is warm.  Psychiatric: She has a normal mood and affect.    ED Course  Procedures (including critical care time) Labs Review Labs Reviewed  CBC WITH DIFFERENTIAL  BASIC METABOLIC PANEL  RETICULOCYTES   Imaging Review No results found.  EKG Interpretation   None       MDM   1. Sickle cell anemia with pain        Elson AreasLeslie K Yilin Weedon, PA-C 04/13/13 1710

## 2013-04-11 NOTE — ED Notes (Addendum)
Left leg pain onset around 1700,  Denies inj   Pain in left groin radiating into thigh

## 2013-04-11 NOTE — ED Notes (Signed)
Pt reports that she developed left leg pain around 1700 after getting off of work, denies any injury to leg, but reports that she does stand all day at work, pt has sickle cell, but states that this is not her usual presentation for crisis. Pt reports that she was recently dx with strep throat

## 2013-04-12 LAB — RETICULOCYTES
RBC.: 4.12 MIL/uL (ref 3.87–5.11)
RETIC CT PCT: 3.3 % — AB (ref 0.4–3.1)
Retic Count, Absolute: 136 10*3/uL (ref 19.0–186.0)

## 2013-04-12 MED ORDER — OXYCODONE-ACETAMINOPHEN 5-325 MG PO TABS: 2.0000 | ORAL_TABLET | ORAL | Status: DC | PRN

## 2013-04-12 MED ORDER — IBUPROFEN 600 MG PO TABS: 600.0000 mg | ORAL_TABLET | Freq: Four times a day (QID) | ORAL | Status: DC | PRN

## 2013-04-12 MED ORDER — HYDROMORPHONE HCL PF 2 MG/ML IJ SOLN
2.0000 mg | Freq: Once | INTRAMUSCULAR | Status: AC
  Administered 2013-04-12: 2 mg via INTRAMUSCULAR
  Filled 2013-04-12: qty 1

## 2013-04-12 NOTE — Discharge Instructions (Signed)
Please take the pain meds as prescribed and see your doctor immediately. If pain is unbearable, you start having fevers, chills -return to the ER immediately.  Sickle Cell Anemia, Adult Sickle cell anemia is a condition in which red blood cells have an abnormal "sickle" shape. This abnormal shape shortens the cells' life span, which results in a lower than normal concentration of red blood cells in the blood. The sickle shape also causes the cells to clump together and block free blood flow through the blood vessels. As a result, the tissues and organs of the body do not receive enough oxygen. Sickle cell anemia causes organ damage and pain and increases the risk of infection. CAUSES  Sickle cell anemia is a genetic disorder. Those who receive two copies of the gene have the condition, and those who receive one copy have the trait. RISK FACTORS The sickle cell gene is most common in people whose families originated in [place]. Other areas of the globe where sickle cell trait occurs include the [place], [place], the [place], and the [place].  SIGNS AND SYMPTOMS  Pain, especially in the extremities, back, chest, or abdomen (common). The pain may start suddenly or may develop following an illness, especially if there is dehydration. Pain can also occur due to overexertion or exposure to extreme temperature changes.  Frequent severe bacterial infections, especially certain types of pneumonia and meningitis.  Pain and swelling in the hands and feet.  Decreased activity.   Loss of appetite.   Change in behavior.  Headaches.  Seizures.  Shortness of breath or difficulty breathing.  Vision changes.  Skin ulcers. Those with the trait may not have symptoms or they may have mild symptoms.  DIAGNOSIS  Sickle cell anemia is diagnosed with blood tests that demonstrate the genetic trait. It is often diagnosed during the newborn period, due to mandatory testing  nationwide. A variety of blood tests, X-rays, CT scans, MRI scans, ultrasounds, and lung function tests may also be done to monitor the condition. TREATMENT  Sickle cell anemia may be treated with:  Medicines. You may be given pain medicines, antibiotic medicines (to treat and prevent infections) or medicines to increase the production of certain types of hemoglobin.  Fluids.  Oxygen.  Blood transfusions. HOME CARE INSTRUCTIONS   Drink enough fluid to keep your urine clear or pale yellow. Increase your fluid intake in hot weather and during exercise.  Do not smoke. Smoking lowers oxygen levels in the blood.   Only take over-the-counter or prescription medicines for pain, fever, or discomfort as directed by your health care provider.  Take antibiotics as directed by your health care provider. Make sure you finish them it even if you start to feel better.   Take supplements as directed by your health care provider.   Consider wearing a medical alert bracelet. This tells anyone caring for you in an emergency of your condition.   When traveling, keep your medical information, health care provider's names, and the medicines you take with you at all times.   If you develop a fever, do not take medicines to reduce the fever right away. This could cover up a problem that is developing. Notify your health care provider.  Keep all follow-up appointments with your health care provider. Sickle cell anemia requires regular medical care. SEEK MEDICAL CARE IF: You have a fever. SEEK IMMEDIATE MEDICAL CARE IF:   You feel dizzy or faint.   You have new abdominal pain, especially on  the left side near the stomach area.   You develop a persistent, often uncomfortable and painful penile erection (priapism). If this is not treated immediately it will lead to impotence.   You have numbness your arms or legs or you have a hard time moving them.   You have a hard time with speech.    You have a fever or persistent symptoms for more than 2 3 days.   You have a fever and your symptoms suddenly get worse.   You have signs or symptoms of infection. These include:   Chills.   Abnormal tiredness (lethargy).   Irritability.   Poor eating.   Vomiting.   You develop pain that is not helped with medicine.   You develop shortness of breath.  You have pain in your chest.   You are coughing up pus-like or bloody sputum.   You develop a stiff neck.  Your feet or hands swell or have pain.  Your abdomen appears bloated.  You develop joint pain. MAKE SURE YOU:  Understand these instructions.  Will watch your child's condition.  Will get help right away if your child is not doing well or gets worse. Document Released: 06/23/2005 Document Revised: 01/03/2013 Document Reviewed: 10/25/2012 [facility] Patient Information 2014 [place], [place].

## 2013-04-14 NOTE — ED Provider Notes (Signed)
Medical screening examination/treatment/procedure(s) were conducted as a shared visit with non-physician practitioner(s) and myself.  I personally evaluated the patient during the encounter.  EKG Interpretation   None       Pt comes in with sickle cell pain. Labs are fine. Pain controlled relatively well. Will start her narcotics.   Derwood KaplanAnkit Caylea Foronda, MD 04/14/13 [phone]

## 2014-05-14 ENCOUNTER — Encounter (HOSPITAL_BASED_OUTPATIENT_CLINIC_OR_DEPARTMENT_OTHER): Payer: Self-pay | Admitting: *Deleted

## 2014-05-14 ENCOUNTER — Emergency Department (HOSPITAL_BASED_OUTPATIENT_CLINIC_OR_DEPARTMENT_OTHER)
Admission: EM | Admit: 2014-05-14 | Discharge: 2014-05-14 | Disposition: A | Discharge status: Home / Self Care | Payer: Medicaid Other | Attending: Emergency Medicine | Admitting: Emergency Medicine

## 2014-05-14 DIAGNOSIS — Z3A25 25 weeks gestation of pregnancy: Secondary | ICD-10-CM | POA: Insufficient documentation

## 2014-05-14 DIAGNOSIS — Z79899 Other long term (current) drug therapy: Secondary | ICD-10-CM | POA: Diagnosis not present

## 2014-05-14 DIAGNOSIS — Z862 Personal history of diseases of the blood and blood-forming organs and certain disorders involving the immune mechanism: Secondary | ICD-10-CM | POA: Insufficient documentation

## 2014-05-14 DIAGNOSIS — O98812 Other maternal infectious and parasitic diseases complicating pregnancy, second trimester: Secondary | ICD-10-CM | POA: Diagnosis not present

## 2014-05-14 DIAGNOSIS — B349 Viral infection, unspecified: Secondary | ICD-10-CM | POA: Diagnosis not present

## 2014-05-14 DIAGNOSIS — Z792 Long term (current) use of antibiotics: Secondary | ICD-10-CM | POA: Insufficient documentation

## 2014-05-14 DIAGNOSIS — O9989 Other specified diseases and conditions complicating pregnancy, childbirth and the puerperium: Secondary | ICD-10-CM | POA: Diagnosis present

## 2014-05-14 LAB — CBC WITH DIFFERENTIAL/PLATELET
Basophils Absolute: 0 10*3/uL (ref 0.0–0.1)
Basophils Relative: 0 % (ref 0–1)
Eosinophils Absolute: 0 10*3/uL (ref 0.0–0.7)
Eosinophils Relative: 0 % (ref 0–5)
HCT: 25.9 % — ABNORMAL LOW (ref 36.0–46.0)
HEMOGLOBIN: 9.2 g/dL — AB (ref 12.0–15.0)
LYMPHS ABS: 1.3 10*3/uL (ref 0.7–4.0)
Lymphocytes Relative: 17 % (ref 12–46)
MCH: 27.1 pg (ref 26.0–34.0)
MCHC: 35.5 g/dL (ref 30.0–36.0)
MCV: 76.4 fL — ABNORMAL LOW (ref 78.0–100.0)
MONOS PCT: 7 % (ref 3–12)
Monocytes Absolute: 0.6 10*3/uL (ref 0.1–1.0)
NEUTROS PCT: 76 % (ref 43–77)
Neutro Abs: 6.1 10*3/uL (ref 1.7–7.7)
Platelets: 159 10*3/uL (ref 150–400)
RBC: 3.39 MIL/uL — AB (ref 3.87–5.11)
RDW: 16.1 % — ABNORMAL HIGH (ref 11.5–15.5)
WBC: 8 10*3/uL (ref 4.0–10.5)

## 2014-05-14 LAB — COMPREHENSIVE METABOLIC PANEL
ALT: 20 U/L (ref 0–35)
AST: 22 U/L (ref 0–37)
Albumin: 3.2 g/dL — ABNORMAL LOW (ref 3.5–5.2)
Alkaline Phosphatase: 40 U/L (ref 39–117)
Anion gap: 1 — ABNORMAL LOW (ref 5–15)
BUN: 5 mg/dL — AB (ref 6–23)
CHLORIDE: 112 mmol/L (ref 96–112)
CO2: 20 mmol/L (ref 19–32)
CREATININE: 0.51 mg/dL (ref 0.50–1.10)
Calcium: 8.2 mg/dL — ABNORMAL LOW (ref 8.4–10.5)
GFR calc Af Amer: >90 mL/min (ref >90)
GLUCOSE: 100 mg/dL — AB (ref 70–99)
Potassium: 3.7 mmol/L (ref 3.5–5.1)
Sodium: 133 mmol/L — ABNORMAL LOW (ref 135–145)
Total Bilirubin: 0.5 mg/dL (ref 0.3–1.2)
Total Protein: 6 g/dL (ref 6.0–8.3)

## 2014-05-14 LAB — URINALYSIS, ROUTINE W REFLEX MICROSCOPIC
Bilirubin Urine: NEGATIVE
Glucose, UA: NEGATIVE mg/dL
Hgb urine dipstick: NEGATIVE
KETONES UR: NEGATIVE mg/dL
NITRITE: NEGATIVE
Protein, ur: NEGATIVE mg/dL
Specific Gravity, Urine: 1.013 (ref 1.005–1.030)
UROBILINOGEN UA: 1 mg/dL (ref 0.0–1.0)
pH: 7 (ref 5.0–8.0)

## 2014-05-14 LAB — URINE MICROSCOPIC-ADD ON

## 2014-05-14 LAB — INFLUENZA PANEL BY PCR (TYPE A & B)
H1N1FLUPCR: NOT DETECTED
Influenza A By PCR: NEGATIVE
Influenza B By PCR: NEGATIVE

## 2014-05-14 MED ORDER — ACETAMINOPHEN 325 MG PO TABS
650.0000 mg | ORAL_TABLET | Freq: Once | ORAL | Status: AC
  Administered 2014-05-14: 650 mg via ORAL
  Filled 2014-05-14: qty 2

## 2014-05-14 NOTE — ED Provider Notes (Addendum)
CSN: [id number]     Arrival date & time 05/14/14  0827 History   First MD Initiated Contact with Patient 05/14/14 [phone]     Chief Complaint  Patient presents with  . Generalized Body Aches     (Consider location/radiation/quality/duration/timing/severity/associated sxs/prior Treatment) HPI Comments: Pt is G2P1 about [redacted] weeks pregnant, who comes in to the ER with cc of generalized weakness, body aches and fever. Denies uri like sx, rashes, bleeding, abd pain, emesis. Pt has no sick contacts. Symptoms started 2 days ago. N uti like sx .   ROS 10 Systems reviewed and are negative for acute change except as noted in the HPI.     The history is provided by the patient.    Past Medical History  Diagnosis Date  . Sickle cell anemia    Past Surgical History  Procedure Laterality Date  . Cesarean section     No family history on file. History  Substance Use Topics  . Smoking status: Never Smoker   . Smokeless tobacco: Not on file  . Alcohol Use: No   OB History    Gravida Para Term Preterm AB TAB SAB Ectopic Multiple Living   1              Review of Systems  Constitutional: Positive for fever, activity change and fatigue.  Musculoskeletal: Positive for myalgias.  Neurological: Positive for weakness.  All other systems reviewed and are negative.     Allergies  Demerol  Home Medications   Prior to Admission medications   Medication Sig Start Date End Date Taking? Authorizing Provider  Prenatal Vit-Fe Fumarate-FA (MULTIVITAMIN-PRENATAL) 27-0.8 MG TABS tablet Take 1 tablet by mouth daily at 12 noon.   Yes Historical Provider, MD  amoxicillin (AMOXIL) 250 MG capsule Take 250 mg by mouth 3 (three) times daily.    Historical Provider, MD  cephALEXin (KEFLEX) 500 MG capsule Take 1 capsule (500 mg total) by mouth 4 (four) times daily. 06/29/12   Elson Areas, PA-C  HYDROcodone-acetaminophen (NORCO/VICODIN) 5-325 MG per tablet Take 1-2 tablets by mouth every 6 (six) hours as  needed for pain. 02/12/12   Carlisle Beers Molpus, MD  HYDROcodone-acetaminophen (NORCO/VICODIN) 5-325 MG per tablet Take 2 tablets by mouth every 4 (four) hours as needed for pain. 06/29/12   Elson Areas, PA-C  HYDROcodone-acetaminophen (NORCO/VICODIN) 5-325 MG per tablet Take 2 tablets by mouth every 6 (six) hours as needed for pain. 01/02/13   Charles B. Bernette Mayers, MD  ibuprofen (ADVIL,MOTRIN) 600 MG tablet Take 1 tablet (600 mg total) by mouth every 6 (six) hours as needed. 04/12/13   Derwood Kaplan, MD  oxyCODONE-acetaminophen (PERCOCET/ROXICET) 5-325 MG per tablet Take 2 tablets by mouth every 4 (four) hours as needed for severe pain. 04/12/13   Lamin Chandley, MD   BP 102/56 mmHg  Pulse 72  Temp(Src) 98.3 F (36.8 C) (Oral)  Resp 16  Ht 5' (1.524 m)  Wt 179 lb 12.8 oz (81.557 kg)  BMI 35.11 kg/m2  SpO2 100% Physical Exam  Constitutional: She is oriented to person, place, and time. She appears well-developed and well-nourished.  HENT:  Head: Normocephalic and atraumatic.  Mouth/Throat: Oropharynx is clear and moist.  Eyes: EOM are normal. Pupils are equal, round, and reactive to light.  Neck: Neck supple.  Cardiovascular: Normal rate, regular rhythm and normal heart sounds.   No murmur heard. Pulmonary/Chest: Effort normal. No respiratory distress.  Abdominal: Soft. She exhibits no distension. There is no tenderness. There is  no rebound and no guarding.  Musculoskeletal: She exhibits no edema or tenderness.  Lymphadenopathy:    She has no cervical adenopathy.  Neurological: She is alert and oriented to person, place, and time.  Skin: Skin is warm and dry.  Nursing note and vitals reviewed.   ED Course  Procedures (including critical care time) Labs Review Labs Reviewed  URINALYSIS, ROUTINE W REFLEX MICROSCOPIC - Abnormal; Notable for the following:    Leukocytes, UA TRACE (*)    All other components within normal limits  URINE MICROSCOPIC-ADD ON - Abnormal; Notable for the  following:    Squamous Epithelial / LPF FEW (*)    Bacteria, UA FEW (*)    All other components within normal limits  CBC WITH DIFFERENTIAL/PLATELET - Abnormal; Notable for the following:    RBC 3.39 (*)    Hemoglobin 9.2 (*)    HCT 25.9 (*)    MCV 76.4 (*)    RDW 16.1 (*)    All other components within normal limits  COMPREHENSIVE METABOLIC PANEL - Abnormal; Notable for the following:    Sodium 133 (*)    Glucose, Bld 100 (*)    BUN 5 (*)    Calcium 8.2 (*)    Albumin 3.2 (*)    Anion gap 1 (*)    All other components within normal limits  INFLUENZA PANEL BY PCR (TYPE A & B, H1N1)    Imaging Review No results found.   EKG Interpretation None      MDM   Final diagnoses:  Viral syndrome    Pt comes in w/ generalized fatigue, and weakness, and subjective fevers. Hx, ROS and Exam not indicative of any source of infection. Pt is really not having any URI like sx either. Plan is to get flu swab. Pt wil get hydration and basic labs here. Will reassess, anticipating discharge.   Derwood KaplanAnkit Rangel Echeverri, MD 05/14/14 1058  2:47 PM Flu study is neg. Labs are reassuring, will d.c.  Derwood KaplanAnkit Cameryn Chrisley, MD 05/14/14 1447

## 2014-05-14 NOTE — Discharge Instructions (Signed)
We saw you in the ER for the body aches, and weakness. All the results in the ER are normal, labs and imaging. We are not sure what is causing your symptoms, we suspect that this VIRAL SYNDROME. Flu results in the ER are normal. The workup in the ER is not complete, and is limited to screening for life threatening and emergent conditions only, so please see a primary care doctor for further evaluation.   Viral Infections A viral infection can be caused by different types of viruses.Most viral infections are not serious and resolve on their own. However, some infections may cause severe symptoms and may lead to further complications. SYMPTOMS Viruses can frequently cause:  Minor sore throat.  Aches and pains.  Headaches.  Runny nose.  Different types of rashes.  Watery eyes.  Tiredness.  Cough.  Loss of appetite.  Gastrointestinal infections, resulting in nausea, vomiting, and diarrhea. These symptoms do not respond to antibiotics because the infection is not caused by bacteria. However, you might catch a bacterial infection following the viral infection. This is sometimes called a "superinfection." Symptoms of such a bacterial infection may include:  Worsening sore throat with pus and difficulty swallowing.  Swollen neck glands.  Chills and a high or persistent fever.  Severe headache.  Tenderness over the sinuses.  Persistent overall ill feeling (malaise), muscle aches, and tiredness (fatigue).  Persistent cough.  Yellow, green, or brown mucus production with coughing. HOME CARE INSTRUCTIONS   Only take over-the-counter or prescription medicines for pain, discomfort, diarrhea, or fever as directed by your caregiver.  Drink enough water and fluids to keep your urine clear or pale yellow. Sports drinks can provide valuable electrolytes, sugars, and hydration.  Get plenty of rest and maintain proper nutrition. Soups and broths with crackers or rice are fine. SEEK  IMMEDIATE MEDICAL CARE IF:   You have severe headaches, shortness of breath, chest pain, neck pain, or an unusual rash.  You have uncontrolled vomiting, diarrhea, or you are unable to keep down fluids.  You or your child has an oral temperature above 102 F (38.9 C), not controlled by medicine.  Your baby is older than 3 months with a rectal temperature of 102 F (38.9 C) or higher.  Your baby is 383 months old or younger with a rectal temperature of 100.4 F (38 C) or higher. MAKE SURE YOU:   Understand these instructions.  Will watch your condition.  Will get help right away if you are not doing well or get worse. Document Released: 12/23/2004 Document Revised: 06/07/2011 Document Reviewed: 07/20/2010 [facility] Patient Information 2015 [place]. This information is not intended to replace advice given to you by your health care provider. Make sure you discuss any questions you have with your health care provider.

## 2014-05-14 NOTE — ED Notes (Signed)
Patient states she has a two day history of generalized body aches which has been associated with a fever of 101.  Denies other symptoms.  Pt did receive a flu shot this year.

## 2015-08-08 ENCOUNTER — Emergency Department (HOSPITAL_BASED_OUTPATIENT_CLINIC_OR_DEPARTMENT_OTHER)
Admission: EM | Admit: 2015-08-08 | Discharge: 2015-08-08 | Disposition: A | Discharge status: Home / Self Care | Payer: Medicaid Other | Attending: Emergency Medicine | Admitting: Emergency Medicine

## 2015-08-08 ENCOUNTER — Encounter (HOSPITAL_BASED_OUTPATIENT_CLINIC_OR_DEPARTMENT_OTHER): Payer: Self-pay

## 2015-08-08 DIAGNOSIS — J988 Other specified respiratory disorders: Secondary | ICD-10-CM

## 2015-08-08 DIAGNOSIS — J069 Acute upper respiratory infection, unspecified: Secondary | ICD-10-CM | POA: Diagnosis not present

## 2015-08-08 DIAGNOSIS — B9789 Other viral agents as the cause of diseases classified elsewhere: Secondary | ICD-10-CM

## 2015-08-08 DIAGNOSIS — J029 Acute pharyngitis, unspecified: Secondary | ICD-10-CM | POA: Diagnosis present

## 2015-08-08 LAB — RAPID STREP SCREEN (MED CTR MEBANE ONLY): STREPTOCOCCUS, GROUP A SCREEN (DIRECT): NEGATIVE

## 2015-08-08 MED ORDER — HYDROCODONE-ACETAMINOPHEN 7.5-325 MG/15ML PO SOLN
15.0000 mL | Freq: Once | ORAL | Status: AC
Start: 2015-08-08 — End: 2015-08-08
  Administered 2015-08-08: 15 mL via ORAL
  Filled 2015-08-08: qty 15

## 2015-08-08 MED ORDER — OXYMETAZOLINE HCL 0.05 % NA SOLN
2.0000 | Freq: Two times a day (BID) | NASAL | Status: DC | PRN
  Administered 2015-08-08: 2 via NASAL
  Filled 2015-08-08: qty 15

## 2015-08-08 MED ORDER — HYDROCODONE-ACETAMINOPHEN 7.5-325 MG/15ML PO SOLN: 10.0000 mL | ORAL | Status: DC | PRN

## 2015-08-08 NOTE — ED Notes (Signed)
C/o congestion sorethroat, body aches x 3-4 days

## 2015-08-08 NOTE — Discharge Instructions (Signed)
Viral Infections °A viral infection can be caused by different types of viruses. Most viral infections are not serious and resolve on their own. However, some infections may cause severe symptoms and may lead to further complications. °SYMPTOMS °Viruses can frequently cause: °· Minor sore throat. °· Aches and pains. °· Headaches. °· Runny nose. °· Different types of rashes. °· Watery eyes. °· Tiredness. °· Cough. °· Loss of appetite. °· Gastrointestinal infections, resulting in nausea, vomiting, and diarrhea. °These symptoms do not respond to antibiotics because the infection is not caused by bacteria. However, you might catch a bacterial infection following the viral infection. This is sometimes called a "superinfection." Symptoms of such a bacterial infection may include: °· Worsening sore throat with pus and difficulty swallowing. °· Swollen neck glands. °· Chills and a high or persistent fever. °· Severe headache. °· Tenderness over the sinuses. °· Persistent overall ill feeling (malaise), muscle aches, and tiredness (fatigue). °· Persistent cough. °· Yellow, green, or brown mucus production with coughing. °HOME CARE INSTRUCTIONS  °· Only take over-the-counter or prescription medicines for pain, discomfort, diarrhea, or fever as directed by your caregiver. °· Drink enough water and fluids to keep your urine clear or pale yellow. Sports drinks can provide valuable electrolytes, sugars, and hydration. °· Get plenty of rest and maintain proper nutrition. Soups and broths with crackers or rice are fine. °SEEK IMMEDIATE MEDICAL CARE IF:  °· You have severe headaches, shortness of breath, chest pain, neck pain, or an unusual rash. °· You have uncontrolled vomiting, diarrhea, or you are unable to keep down fluids. °· You or your child has an oral temperature above 102° F (38.9° C), not controlled by medicine. °· Your baby is older than 3 months with a rectal temperature of 102° F (38.9° C) or higher. °· Your baby is 3  months old or younger with a rectal temperature of 100.4° F (38° C) or higher. °MAKE SURE YOU:  °· Understand these instructions. °· Will watch your condition. °· Will get help right away if you are not doing well or get worse. °  °This information is not intended to replace advice given to you by your health care provider. Make sure you discuss any questions you have with your health care provider. °  °Document Released: 12/23/2004 Document Revised: 06/07/2011 Document Reviewed: 08/21/2014 °Elsevier Interactive Patient Education ©2016 [employer] ° °

## 2015-08-08 NOTE — ED Provider Notes (Signed)
CSN: [id number]     Arrival date & time 08/08/15  0111 History   First MD Initiated Contact with Patient 08/08/15 0204     Chief Complaint  Patient presents with  . Sore Throat     (Consider location/radiation/quality/duration/timing/severity/associated sxs/prior Treatment) HPI This is a 25 year old female with sickle cell disease who is here with a three-day history of sore throat, body aches and nasal congestion. She states her throat hurts "really bad", worse with swallowing. She has been taking Mucinex and ibuprofen without relief. She is not aware of having a fever. She denies cough or shortness of breath.  Past Medical History  Diagnosis Date  . Sickle cell anemia [facility](HCC)    Past Surgical History  Procedure Laterality Date  . Cesarean section     No family history on file. Social History  Substance Use Topics  . Smoking status: Never Smoker   . Smokeless tobacco: None  . Alcohol Use: No   OB History    Gravida Para Term Preterm AB TAB SAB Ectopic Multiple Living   1              Review of Systems  All other systems reviewed and are negative.   Allergies  Demerol  Home Medications   Prior to Admission medications   Medication Sig Start Date End Date Taking? Authorizing Provider  amoxicillin (AMOXIL) 250 MG capsule Take 250 mg by mouth 3 (three) times daily.    Historical Provider, MD  cephALEXin (KEFLEX) 500 MG capsule Take 1 capsule (500 mg total) by mouth 4 (four) times daily. 06/29/12   Elson AreasLeslie K Sofia, PA-C  HYDROcodone-acetaminophen (NORCO/VICODIN) 5-325 MG per tablet Take 1-2 tablets by mouth every 6 (six) hours as needed for pain. 02/12/12   Michio Thier, MD  HYDROcodone-acetaminophen (NORCO/VICODIN) 5-325 MG per tablet Take 2 tablets by mouth every 4 (four) hours as needed for pain. 06/29/12   Elson AreasLeslie K Sofia, PA-C  HYDROcodone-acetaminophen (NORCO/VICODIN) 5-325 MG per tablet Take 2 tablets by mouth every 6 (six) hours as needed for pain. 01/02/13   Susy Frizzleharles  Sheldon, MD  ibuprofen (ADVIL,MOTRIN) 600 MG tablet Take 1 tablet (600 mg total) by mouth every 6 (six) hours as needed. 04/12/13   Derwood KaplanAnkit Nanavati, MD  oxyCODONE-acetaminophen (PERCOCET/ROXICET) 5-325 MG per tablet Take 2 tablets by mouth every 4 (four) hours as needed for severe pain. 04/12/13   Derwood KaplanAnkit Nanavati, MD  Prenatal Vit-Fe Fumarate-FA (MULTIVITAMIN-PRENATAL) 27-0.8 MG TABS tablet Take 1 tablet by mouth daily at 12 noon.    Historical Provider, MD   BP 113/84 mmHg  Pulse 82  Temp(Src) 98.6 F (37 C) (Oral)  Resp 18  Ht 5' (1.524 m)  Wt 165 lb (74.844 kg)  BMI 32.22 kg/m2  SpO2 99%  Breastfeeding? Unknown   Physical Exam  General: Well-developed, well-nourished female in no acute distress; appearance consistent with age of record HENT: normocephalic; atraumatic; nasal congestion; mild pharyngeal erythema without exudate Eyes: pupils equal, round and reactive to light; extraocular muscles intact Neck: supple Heart: regular rate and rhythm Lungs: clear to auscultation bilaterally Abdomen: soft; nondistended; nontender; no masses or hepatosplenomegaly; bowel sounds present Extremities: No deformity; full range of motion; pulses normal Neurologic: Awake, alert and oriented; motor function intact in all extremities and symmetric; no facial droop Skin: Warm and dry Psychiatric: Flat affect  ED Course  Procedures (including critical care time)   MDM     Paula LibraJohn Lisbet Busker, MD 08/08/15 [phone]

## 2015-08-08 NOTE — ED Notes (Signed)
Pt c/o sore throat, running nose and congestion x3 days

## 2015-08-10 LAB — CULTURE, GROUP A STREP (THRC)

## 2016-03-17 ENCOUNTER — Emergency Department (HOSPITAL_BASED_OUTPATIENT_CLINIC_OR_DEPARTMENT_OTHER)
Admission: EM | Admit: 2016-03-17 | Discharge: 2016-03-17 | Disposition: A | Discharge status: Home / Self Care | Payer: Medicaid Other | Attending: Physician Assistant | Admitting: Physician Assistant

## 2016-03-17 ENCOUNTER — Emergency Department (HOSPITAL_BASED_OUTPATIENT_CLINIC_OR_DEPARTMENT_OTHER): Payer: Medicaid Other

## 2016-03-17 ENCOUNTER — Encounter (HOSPITAL_BASED_OUTPATIENT_CLINIC_OR_DEPARTMENT_OTHER): Payer: Self-pay

## 2016-03-17 DIAGNOSIS — J069 Acute upper respiratory infection, unspecified: Secondary | ICD-10-CM | POA: Insufficient documentation

## 2016-03-17 DIAGNOSIS — B9789 Other viral agents as the cause of diseases classified elsewhere: Secondary | ICD-10-CM

## 2016-03-17 DIAGNOSIS — R05 Cough: Secondary | ICD-10-CM | POA: Diagnosis present

## 2016-03-17 LAB — RAPID STREP SCREEN (MED CTR MEBANE ONLY): STREPTOCOCCUS, GROUP A SCREEN (DIRECT): NEGATIVE

## 2016-03-17 MED ORDER — ACETAMINOPHEN 325 MG PO TABS
650.0000 mg | ORAL_TABLET | Freq: Once | ORAL | Status: AC
  Administered 2016-03-17: 650 mg via ORAL
  Filled 2016-03-17: qty 2

## 2016-03-17 MED ORDER — HYDROCODONE-HOMATROPINE 5-1.5 MG/5ML PO SYRP: 5.0000 mL | ORAL_SOLUTION | Freq: Four times a day (QID) | ORAL | 0 refills | Status: DC | PRN

## 2016-03-17 NOTE — ED Notes (Signed)
Patient transported to X-ray 

## 2016-03-17 NOTE — ED Notes (Signed)
Pt returned from xray

## 2016-03-17 NOTE — ED Triage Notes (Signed)
C/o cough since Sunday-NAD-steady gait 

## 2016-03-17 NOTE — Discharge Instructions (Signed)
Your xray and rapid strep are negative today.  This is likely viral.  Take Hycodan nightly for cough.  Continue taking Ibuprofen.  Drink plenty of fluids.  Return to the ED for new or worsening symptoms.

## 2016-03-17 NOTE — ED Notes (Signed)
Pt teaching provided on medications that may cause drowsiness. Pt instructed not to drive or operate heavy machinery while taking the prescribed medication. Pt verbalized understanding.   

## 2016-03-17 NOTE — ED Provider Notes (Signed)
[facility] Provider Note   CSN: [id number] Arrival date & time: 03/17/16  2027  By signing my name below, I, Linna DarnerRussell Turner, attest that this documentation has been prepared under the direction and in the presence Cheri FowlerKayla Nate Perri, PA-C. Electronically Signed: Linna Darnerussell Turner, Scribe. 03/17/2016. 8:49 PM.  History   Chief Complaint Chief Complaint  Patient presents with  . Cough    The history is provided by the patient. No language interpreter was used.    HPI Comments: Stacy [name] is a 25 y.o. female who presents to the Emergency Department complaining of a persistent, unchanged dry cough beginning two days ago. She reports associated sore throat and generalized myalgias. She notes her daughter recently had cold-like symptoms which have resolved. Patient has tried Dayquil, Nyquil, ibuprofen, and TheraFlu with no relief of her symptoms; she last took ibuprofen 600 mg shortly PTA. She states she felt completely fine 3 days ago and woke up the following day with her present symptoms. She denies nausea, vomiting, diarrhea, rhinorrhea, fever, chills, or any other associated symptoms.  Past Medical History:  Diagnosis Date  . Sickle cell anemia (HCC)     There are no active problems to display for this patient.   Past Surgical History:  Procedure Laterality Date  . CESAREAN SECTION      OB History    Gravida Para Term Preterm AB Living   1             SAB TAB Ectopic Multiple Live Births                   Home Medications    Prior to Admission medications   Medication Sig Start Date End Date Taking? Authorizing Provider  HYDROcodone-homatropine (HYCODAN) 5-1.5 MG/5ML syrup Take 5 mLs by mouth every 6 (six) hours as needed for cough. 03/17/16   Cheri FowlerKayla Lovelle Deitrick, PA-C    Family History No family history on file.  Social History Social History  Substance Use Topics  . Smoking status: Never Smoker  . Smokeless tobacco: Never Used  . Alcohol use No      Allergies   Demerol [meperidine]   Review of Systems Review of Systems  Constitutional: Negative for chills and fever.  HENT: Positive for sore throat. Negative for rhinorrhea.   Respiratory: Positive for cough.   Gastrointestinal: Negative for diarrhea, nausea and vomiting.  Musculoskeletal: Positive for myalgias (generalized).  All other systems reviewed and are negative.    Physical Exam Updated Vital Signs BP 109/75 (BP Location: Left Arm)   Pulse 75   Temp 98.3 F (36.8 C) (Oral)   Resp 20   Ht 5' (1.524 m)   Wt 72.6 kg   LMP  (LMP Unknown)   SpO2 98%   BMI 31.25 kg/m   Physical Exam  Constitutional: She is oriented to person, place, and time. She appears well-developed and well-nourished. She is active.  Non-toxic appearance. She does not have a sickly appearance. She does not appear ill.  HENT:  Head: Normocephalic and atraumatic.  Right Ear: Tympanic membrane and external ear normal. Tympanic membrane is not erythematous and not bulging.  Left Ear: Tympanic membrane and external ear normal. Tympanic membrane is not erythematous and not bulging.  Nose: Nose normal.  Mouth/Throat: Uvula is midline and mucous membranes are normal. No trismus in the jaw. No uvula swelling. Posterior oropharyngeal erythema present. No oropharyngeal exudate, posterior oropharyngeal edema or tonsillar abscesses.  Neck: Normal range of motion. Neck supple.  No nuchal rigidity.   Cardiovascular: Normal rate and regular rhythm.   Pulmonary/Chest: Effort normal and breath sounds normal. No respiratory distress. She has no wheezes. She has no rales.  Abdominal: Soft. Bowel sounds are normal. She exhibits no distension. There is no tenderness.  Musculoskeletal: Normal range of motion.  Lymphadenopathy:    She has cervical adenopathy.  Neurological: She is alert and oriented to person, place, and time.  Skin: Skin is warm and dry.  Psychiatric: She has a normal mood and affect. Her  behavior is normal.     ED Treatments / Results  Labs (all labs ordered are listed, but only abnormal results are displayed) Labs Reviewed  R[facility]CREEN ([facility])  CULTURE, GROUP A STREP [facility])    EKG  EKG Interpretation None       Radiology Dg Chest 2 View  Result Date: 03/17/2016 CLINICAL DATA:  Initial evaluation for acute cough, sore throat, chest pain. EXAM: CHEST  2 VIEW COMPARISON:  Prior radiograph from 01/15/2016. FINDINGS: The cardiac and mediastinal silhouettes are stable in size and contour, and remain within normal limits. The lungs are normally inflated. No airspace consolidation, pleural effusion, or pulmonary edema is identified. There is no pneumothorax. No acute osseous abnormality identified. IMPRESSION: No active cardiopulmonary disease. Electronically Signed   By: Rise MuBenjamin  McClintock M.D.   On: 03/17/2016 21:20    Procedures Procedures (including critical care time)  DIAGNOSTIC STUDIES: Oxygen Saturation is 100% on RA, normal by my interpretation.    COORDINATION OF CARE: 8:55 PM Discussed treatment plan with pt at bedside and pt agreed to plan.  Medications Ordered in ED Medications  acetaminophen (TYLENOL) tablet 650 mg (650 mg Oral Given 03/17/16 2121)     Initial Impression / Assessment and Plan / ED Course  I have reviewed the triage vital signs and the nursing notes.  Pertinent labs & imaging results that were available during my care of the patient were reviewed by me and considered in my medical decision making (see chart for details).  Clinical Course    Pt symptoms consistent with URI. CXR negative for acute infiltrate. Pt will be discharged with symptomatic treatment.  Discussed return precautions.  Pt is hemodynamically stable & in NAD prior to discharge.   Final Clinical Impressions(s) / ED Diagnoses   Final diagnoses:  Viral URI with cough    New Prescriptions New Prescriptions   HYDROCODONE-HOMATROPINE (HYCODAN)  5-1.5 MG/5ML SYRUP    Take 5 mLs by mouth every 6 (six) hours as needed for cough.   I personally performed the services described in this documentation, which was scribed in my presence. The recorded information has been reviewed and is accurate.    Cheri FowlerKayla Bowie Delia, PA-C 03/17/16 2244    Courteney Randall AnLyn Mackuen, MD 03/18/16 [id number]

## 2016-03-20 LAB — CULTURE, GROUP A STREP (THRC)

## 2017-01-14 ENCOUNTER — Encounter (HOSPITAL_BASED_OUTPATIENT_CLINIC_OR_DEPARTMENT_OTHER): Payer: Self-pay | Admitting: *Deleted

## 2017-01-14 DIAGNOSIS — M791 Myalgia, unspecified site: Secondary | ICD-10-CM | POA: Insufficient documentation

## 2017-01-14 DIAGNOSIS — Z5321 Procedure and treatment not carried out due to patient leaving prior to being seen by health care provider: Secondary | ICD-10-CM | POA: Insufficient documentation

## 2017-01-14 NOTE — ED Triage Notes (Addendum)
Generalized body aches. Hx of sickle cell anemia. She took Motrin 800 mg an hour ago.

## 2017-01-15 ENCOUNTER — Emergency Department (HOSPITAL_BASED_OUTPATIENT_CLINIC_OR_DEPARTMENT_OTHER)
Admission: EM | Admit: 2017-01-15 | Discharge: 2017-01-15 | Disposition: A | Discharge status: Home / Self Care | Payer: Self-pay | Attending: Emergency Medicine | Admitting: Emergency Medicine

## 2017-08-21 ENCOUNTER — Encounter (HOSPITAL_BASED_OUTPATIENT_CLINIC_OR_DEPARTMENT_OTHER): Payer: Self-pay | Admitting: Emergency Medicine

## 2017-08-21 ENCOUNTER — Emergency Department (HOSPITAL_BASED_OUTPATIENT_CLINIC_OR_DEPARTMENT_OTHER): Payer: BLUE CROSS/BLUE SHIELD

## 2017-08-21 ENCOUNTER — Other Ambulatory Visit: Payer: Self-pay

## 2017-08-21 ENCOUNTER — Emergency Department (HOSPITAL_BASED_OUTPATIENT_CLINIC_OR_DEPARTMENT_OTHER)
Admission: EM | Admit: 2017-08-21 | Discharge: 2017-08-22 | Disposition: A | Discharge status: Home / Self Care | Payer: BLUE CROSS/BLUE SHIELD | Attending: Emergency Medicine | Admitting: Emergency Medicine

## 2017-08-21 DIAGNOSIS — R509 Fever, unspecified: Secondary | ICD-10-CM

## 2017-08-21 DIAGNOSIS — J029 Acute pharyngitis, unspecified: Secondary | ICD-10-CM

## 2017-08-21 DIAGNOSIS — D5700 Hb-SS disease with crisis, unspecified: Secondary | ICD-10-CM

## 2017-08-21 LAB — BASIC METABOLIC PANEL
ANION GAP: 9 (ref 5–15)
BUN: 8 mg/dL (ref 6–20)
CALCIUM: 8.5 mg/dL — AB (ref 8.9–10.3)
CO2: 23 mmol/L (ref 22–32)
Chloride: 107 mmol/L (ref 101–111)
Creatinine, Ser: 0.75 mg/dL (ref 0.44–1.00)
GFR calc Af Amer: >60 mL/min (ref >60)
Glucose, Bld: 99 mg/dL (ref 65–99)
Potassium: 3.5 mmol/L (ref 3.5–5.1)
Sodium: 139 mmol/L (ref 135–145)

## 2017-08-21 MED ORDER — SODIUM CHLORIDE 0.9 % IV BOLUS
1000.0000 mL | Freq: Once | INTRAVENOUS | Status: AC
  Administered 2017-08-22: 1000 mL via INTRAVENOUS

## 2017-08-21 MED ORDER — KETOROLAC TROMETHAMINE 15 MG/ML IJ SOLN
15.0000 mg | Freq: Once | INTRAMUSCULAR | Status: AC
  Administered 2017-08-22: 15 mg via INTRAVENOUS
  Filled 2017-08-21: qty 1

## 2017-08-21 MED ORDER — MORPHINE SULFATE (PF) 4 MG/ML IV SOLN
4.0000 mg | Freq: Once | INTRAVENOUS | Status: AC
  Administered 2017-08-22: 4 mg via INTRAVENOUS
  Filled 2017-08-21: qty 1

## 2017-08-21 NOTE — ED Provider Notes (Signed)
[facility] EMERGENCY DEPARTMENT Provider Note   CSN: [id number] Arrival date & time: 08/21/17  2220     History   Chief Complaint Chief Complaint  Patient presents with  . Sickle Cell Pain Crisis    HPI Stacy [name] is a 27 y.o. female.  HPI  This is a 27 year old female with a history of sickle cell anemia (sickle cell  disease) who presents with fever and body aches.  Patient reports onset of symptoms in the last 12 hours.  She reports generalized myalgias and body aches.  Ports temperature at home to 101.  She reports "pain all over."  She rates her pain at 9 out of 10.  She does not take anything daily for pain.  She reports sore throat.  Denies congestion, abdominal pain, nausea, vomiting, dysuria, back pain.  Temperature upon arrival 99.8.  She has not taken anything for her symptoms.  Past Medical History:  Diagnosis Date  . Sickle cell anemia (HCC)     There are no active problems to display for this patient.   Past Surgical History:  Procedure Laterality Date  . CESAREAN SECTION       OB History    Gravida  1   Para      Term      Preterm      AB      Living        SAB      TAB      Ectopic      Multiple      Live Births               Home Medications    Prior to Admission medications   Medication Sig Start Date End Date Taking? Authorizing Provider  HYDROcodone-homatropine (HYCODAN) 5-1.5 MG/5ML syrup Take 5 mLs by mouth every 6 (six) hours as needed for cough. 03/17/16   Cheri Fowler, PA-C    Family History No family history on file.  Social History Social History   Tobacco Use  . Smoking status: Never Smoker  . Smokeless tobacco: Never Used  Substance Use Topics  . Alcohol use: No  . Drug use: No     Allergies   Demerol [meperidine]   Review of Systems Review of Systems  Constitutional: Negative for fever.  HENT: Positive for sore throat. Negative for congestion.   Respiratory: Negative for  cough and shortness of breath.   Cardiovascular: Negative for chest pain.  Gastrointestinal: Negative for abdominal pain, nausea and vomiting.  Genitourinary: Negative for dysuria and flank pain.  Musculoskeletal: Positive for myalgias. Negative for back pain.  Skin: Negative for rash.  All other systems reviewed and are negative.    Physical Exam Updated Vital Signs BP 124/89 (BP Location: Right Arm)   Pulse 95   Temp 98.5 F (36.9 C) (Oral)   Resp 18   Ht 5' (1.524 m)   Wt 77.1 kg (170 lb)   SpO2 99%   BMI 33.20 kg/m   Physical Exam  Constitutional: She is oriented to person, place, and time. She appears well-developed and well-nourished. No distress.  HENT:  Head: Normocephalic and atraumatic.  Mouth/Throat: Oropharynx is clear and moist.  Uvula midline, bilateral tonsils without significant enlargement, no tonsillar exudate  Eyes: Pupils are equal, round, and reactive to light.  Neck: Neck supple.  Cardiovascular: Normal rate, regular rhythm and normal heart sounds.  Pulmonary/Chest: Effort normal. No respiratory distress. She has no wheezes.  Abdominal: Soft. Bowel  sounds are normal. There is no tenderness. There is no rebound and no guarding.  Musculoskeletal: She exhibits no edema.  Neurological: She is alert and oriented to person, place, and time.  Skin: Skin is warm and dry.  Psychiatric: She has a normal mood and affect.  Nursing note and vitals reviewed.    ED Treatments / Results  Labs (all labs ordered are listed, but only abnormal results are displayed) Labs Reviewed  CBC WITH DIFFERENTIAL/PLATELET - Abnormal; Notable for the following components:      Result Value   WBC 11.3 (*)    RBC 3.27 (*)    Hemoglobin 9.9 (*)    HCT 25.3 (*)    MCV 77.4 (*)    MCHC 39.1 (*)    RDW 16.0 (*)    Platelets 144 (*)    Neutro Abs 9.4 (*)    All other components within normal limits  BASIC METABOLIC PANEL - Abnormal; Notable for the following components:    Calcium 8.5 (*)    All other components within normal limits  RETICULOCYTES - Abnormal; Notable for the following components:   Retic Ct Pct 3.9 (*)    RBC. 3.52 (*)    All other components within normal limits  URINALYSIS, ROUTINE W REFLEX MICROSCOPIC - Abnormal; Notable for the following components:   APPearance CLOUDY (*)    Hgb urine dipstick LARGE (*)    Nitrite POSITIVE (*)    Leukocytes, UA TRACE (*)    All other components within normal limits  URINALYSIS, MICROSCOPIC (REFLEX) - Abnormal; Notable for the following components:   Bacteria, UA MANY (*)    All other components within normal limits  RAPID STREP SCREEN ([facility] ONLY)  CULTURE, GROUP A STREP ([facility])  CULTURE, BLOOD (ROUTINE X 2)  CULTURE, BLOOD (ROUTINE X 2)  PREGNANCY, URINE    EKG None  Radiology Dg Chest 2 View  Result Date: 08/21/2017 CLINICAL DATA:  Fever, sickle cell crisis EXAM: CHEST - 2 VIEW COMPARISON:  01/13/2017 FINDINGS: The heart size and mediastinal contours are within normal limits. Both lungs are clear. The visualized skeletal structures are unremarkable. IMPRESSION: No active cardiopulmonary disease. Electronically Signed   By: Elige Ko   On: 08/21/2017 23:19    Procedures Procedures (including critical care time)  Medications Ordered in ED Medications  sodium chloride 0.9 % bolus 1,000 mL (0 mLs Intravenous Stopped 08/22/17 0030)  morphine 4 MG/ML injection 4 mg (4 mg Intravenous Given 08/22/17 0008)  ketorolac (TORADOL) 15 MG/ML injection 15 mg (15 mg Intravenous Given 08/22/17 0008)  acetaminophen (TYLENOL) tablet 650 mg (650 mg Oral Given 08/22/17 0103)  cefTRIAXone (ROCEPHIN) 1 g in sodium chloride 0.9 % 100 mL IVPB (0 g Intravenous Stopped 08/22/17 0209)  HYDROmorphone (DILAUDID) injection 1 mg (1 mg Intravenous Given 08/22/17 0144)     Initial Impression / Assessment and Plan / ED Course  I have reviewed the triage vital signs and the nursing notes.  Pertinent labs & imaging  results that were available during my care of the patient were reviewed by me and considered in my medical decision making (see chart for details).  Clinical Course as of Aug 22 537  Mon Aug 22, 2017  0137 Continues to have some pain.  REdosed medications.   [CH]    Clinical Course User Index [CH] Horton, Mayer Masker, MD    Patient presents with myalgias.  Reports fevers at home.  She is overall nontoxic-appearing and vital signs are  reassuring upon arrival here.  She denies any specific symptoms with the exception of sore throat.    Strep screen is negative.  Basic lab work-up is largely unremarkable.  Blood cultures were obtained and pending.  Patient was treated for an acute crisis.  On recheck after re-doses of medication, patient states she feels somewhat better.  Pain is now 6 out of 10 and tolerable.  She is able to tolerate fluids.  Patient was given a dose of IV Rocephin given her fever and sickle cell disease.  She has close follow-up.  Of note, urinalysis missed prior to discharge.  She is nitrite positive with 6-10 white cells and many bacteria.  We will culture.  She did receive IV Rocephin.  Will call and send in a prescription for Keflex for UTI.  6:55 AM Patient contacted by charge RN.  Rx called into local pharmacy.  Final Clinical Impressions(s) / ED Diagnoses   Final diagnoses:  Sickle cell anemia with pain (HCC)  Sore throat  Fever, unspecified fever cause    ED Discharge Orders    None       Shon Baton, MD 08/22/17 [phone]

## 2017-08-21 NOTE — ED Triage Notes (Addendum)
Pt presents with c/o fever that started today. Pt has Sickle cell. PT wearing winter jacket. This RN ask pt to take jacket off pt refused states she is cold and will not take it off. Explained to pt that it will make her temp higher

## 2017-08-22 LAB — URINALYSIS, MICROSCOPIC (REFLEX)

## 2017-08-22 LAB — CBC WITH DIFFERENTIAL/PLATELET
BASOS ABS: 0 10*3/uL (ref 0.0–0.1)
Basophils Relative: 0 %
Eosinophils Absolute: 0 10*3/uL (ref 0.0–0.7)
Eosinophils Relative: 0 %
HEMATOCRIT: 25.3 % — AB (ref 36.0–46.0)
Hemoglobin: 9.9 g/dL — ABNORMAL LOW (ref 12.0–15.0)
LYMPHS ABS: 1.1 10*3/uL (ref 0.7–4.0)
LYMPHS PCT: 10 %
MCH: 30.3 pg (ref 26.0–34.0)
MCHC: 39.1 g/dL — AB (ref 30.0–36.0)
MCV: 77.4 fL — ABNORMAL LOW (ref 78.0–100.0)
Monocytes Absolute: 0.8 10*3/uL (ref 0.1–1.0)
Monocytes Relative: 7 %
Neutro Abs: 9.4 10*3/uL — ABNORMAL HIGH (ref 1.7–7.7)
Neutrophils Relative %: 83 %
PLATELETS: 144 10*3/uL — AB (ref 150–400)
RBC: 3.27 MIL/uL — AB (ref 3.87–5.11)
RDW: 16 % — ABNORMAL HIGH (ref 11.5–15.5)
WBC: 11.3 10*3/uL — ABNORMAL HIGH (ref 4.0–10.5)

## 2017-08-22 LAB — URINALYSIS, ROUTINE W REFLEX MICROSCOPIC
BILIRUBIN URINE: NEGATIVE
GLUCOSE, UA: NEGATIVE mg/dL
Ketones, ur: NEGATIVE mg/dL
Nitrite: POSITIVE — AB
PH: 6 (ref 5.0–8.0)
PROTEIN: NEGATIVE mg/dL
Specific Gravity, Urine: 1.015 (ref 1.005–1.030)

## 2017-08-22 LAB — RETICULOCYTES
RBC.: 3.52 MIL/uL — AB (ref 3.87–5.11)
RETIC COUNT ABSOLUTE: 137.3 10*3/uL (ref 19.0–186.0)
Retic Ct Pct: 3.9 % — ABNORMAL HIGH (ref 0.4–3.1)

## 2017-08-22 LAB — PREGNANCY, URINE: Preg Test, Ur: NEGATIVE

## 2017-08-22 LAB — RAPID STREP SCREEN (MED CTR MEBANE ONLY): Streptococcus, Group A Screen (Direct): NEGATIVE

## 2017-08-22 MED ORDER — ACETAMINOPHEN 325 MG PO TABS
650.0000 mg | ORAL_TABLET | Freq: Once | ORAL | Status: AC
  Administered 2017-08-22: 650 mg via ORAL
  Filled 2017-08-22: qty 2

## 2017-08-22 MED ORDER — CEFTRIAXONE SODIUM 1 G IJ SOLR
1.0000 g | Freq: Once | INTRAMUSCULAR | Status: AC
  Administered 2017-08-22: 1 g via INTRAVENOUS
  Filled 2017-08-22: qty 10

## 2017-08-22 MED ORDER — HYDROMORPHONE HCL 1 MG/ML IJ SOLN
1.0000 mg | Freq: Once | INTRAMUSCULAR | Status: AC
  Administered 2017-08-22: 1 mg via INTRAVENOUS
  Filled 2017-08-22: qty 1

## 2017-08-22 NOTE — Discharge Instructions (Signed)
You were seen today for fever and body pain.  The source of your fever is unknown at this time.  Your strep screen and other work-up is reassuring.  Follow-up very closely with your hematologist.  If you continue to have fevers or any new or worsening symptoms you need to be reevaluated immediately.

## 2017-08-24 LAB — CULTURE, GROUP A STREP (THRC)

## 2017-08-27 ENCOUNTER — Emergency Department (HOSPITAL_BASED_OUTPATIENT_CLINIC_OR_DEPARTMENT_OTHER): Payer: BLUE CROSS/BLUE SHIELD

## 2017-08-27 ENCOUNTER — Encounter (HOSPITAL_BASED_OUTPATIENT_CLINIC_OR_DEPARTMENT_OTHER): Payer: Self-pay | Admitting: Emergency Medicine

## 2017-08-27 ENCOUNTER — Inpatient Hospital Stay (HOSPITAL_BASED_OUTPATIENT_CLINIC_OR_DEPARTMENT_OTHER)
Admission: EM | Admit: 2017-08-27 | Discharge: 2017-09-02 | DRG: 811 | Disposition: A | Discharge status: Home / Self Care | Payer: BLUE CROSS/BLUE SHIELD | Attending: Internal Medicine | Admitting: Internal Medicine

## 2017-08-27 ENCOUNTER — Other Ambulatory Visit: Payer: Self-pay

## 2017-08-27 DIAGNOSIS — Z832 Family history of diseases of the blood and blood-forming organs and certain disorders involving the immune mechanism: Secondary | ICD-10-CM | POA: Diagnosis not present

## 2017-08-27 DIAGNOSIS — R079 Chest pain, unspecified: Secondary | ICD-10-CM

## 2017-08-27 DIAGNOSIS — J189 Pneumonia, unspecified organism: Secondary | ICD-10-CM

## 2017-08-27 DIAGNOSIS — D57219 Sickle-cell/Hb-C disease with crisis, unspecified: Secondary | ICD-10-CM | POA: Diagnosis not present

## 2017-08-27 DIAGNOSIS — Y95 Nosocomial condition: Secondary | ICD-10-CM | POA: Diagnosis not present

## 2017-08-27 DIAGNOSIS — R701 Abnormal plasma viscosity: Secondary | ICD-10-CM

## 2017-08-27 DIAGNOSIS — R0789 Other chest pain: Secondary | ICD-10-CM

## 2017-08-27 DIAGNOSIS — Z885 Allergy status to narcotic agent status: Secondary | ICD-10-CM | POA: Diagnosis not present

## 2017-08-27 DIAGNOSIS — J181 Lobar pneumonia, unspecified organism: Secondary | ICD-10-CM | POA: Diagnosis not present

## 2017-08-27 DIAGNOSIS — D5700 Hb-SS disease with crisis, unspecified: Secondary | ICD-10-CM

## 2017-08-27 DIAGNOSIS — G894 Chronic pain syndrome: Secondary | ICD-10-CM | POA: Diagnosis present

## 2017-08-27 LAB — HCG, QUANTITATIVE, PREGNANCY: hCG, Beta Chain, Quant, S: <1 m[IU]/mL (ref <5)

## 2017-08-27 LAB — CBC WITH DIFFERENTIAL/PLATELET
BASOS PCT: 0 %
Basophils Absolute: 0 10*3/uL (ref 0.0–0.1)
EOS PCT: 1 %
Eosinophils Absolute: 0.1 10*3/uL (ref 0.0–0.7)
HCT: 28.2 % — ABNORMAL LOW (ref 36.0–46.0)
Hemoglobin: 10.5 g/dL — ABNORMAL LOW (ref 12.0–15.0)
Lymphocytes Relative: 38 %
Lymphs Abs: 3.4 10*3/uL (ref 0.7–4.0)
MCH: 27.4 pg (ref 26.0–34.0)
MCHC: 37.2 g/dL — ABNORMAL HIGH (ref 30.0–36.0)
MCV: 73.6 fL — AB (ref 78.0–100.0)
MONO ABS: 0.6 10*3/uL (ref 0.1–1.0)
Monocytes Relative: 7 %
NEUTROS PCT: 54 %
Neutro Abs: 4.8 10*3/uL (ref 1.7–7.7)
Platelets: 293 10*3/uL (ref 150–400)
RBC: 3.83 MIL/uL — ABNORMAL LOW (ref 3.87–5.11)
RDW: 17 % — AB (ref 11.5–15.5)
WBC: 8.9 10*3/uL (ref 4.0–10.5)

## 2017-08-27 LAB — CULTURE, BLOOD (ROUTINE X 2)
CULTURE: NO GROWTH
Culture: NO GROWTH
SPECIAL REQUESTS: ADEQUATE
Special Requests: ADEQUATE

## 2017-08-27 LAB — COMPREHENSIVE METABOLIC PANEL
ALK PHOS: 50 U/L (ref 38–126)
ALT: 21 U/L (ref 14–54)
AST: 20 U/L (ref 15–41)
Albumin: 4.1 g/dL (ref 3.5–5.0)
Anion gap: 7 (ref 5–15)
BUN: 7 mg/dL (ref 6–20)
CALCIUM: 8.9 mg/dL (ref 8.9–10.3)
CO2: 24 mmol/L (ref 22–32)
CREATININE: 0.72 mg/dL (ref 0.44–1.00)
Chloride: 107 mmol/L (ref 101–111)
GFR calc Af Amer: >60 mL/min (ref >60)
GLUCOSE: 94 mg/dL (ref 65–99)
Potassium: 3.8 mmol/L (ref 3.5–5.1)
Sodium: 138 mmol/L (ref 135–145)
TOTAL PROTEIN: 7.4 g/dL (ref 6.5–8.1)
Total Bilirubin: 0.5 mg/dL (ref 0.3–1.2)

## 2017-08-27 LAB — URINALYSIS, ROUTINE W REFLEX MICROSCOPIC
Bilirubin Urine: NEGATIVE
GLUCOSE, UA: NEGATIVE mg/dL
Hgb urine dipstick: NEGATIVE
Ketones, ur: NEGATIVE mg/dL
LEUKOCYTES UA: NEGATIVE
NITRITE: NEGATIVE
PH: 5.5 (ref 5.0–8.0)
Protein, ur: NEGATIVE mg/dL
Specific Gravity, Urine: <1.005 — ABNORMAL LOW (ref 1.005–1.030)

## 2017-08-27 LAB — RETICULOCYTES
RBC.: 3.93 MIL/uL (ref 3.87–5.11)
Retic Count, Absolute: 208.3 10*3/uL — ABNORMAL HIGH (ref 19.0–186.0)
Retic Ct Pct: 5.3 % — ABNORMAL HIGH (ref 0.4–3.1)

## 2017-08-27 LAB — TROPONIN I: Troponin I: <0.03 ng/mL (ref <0.03)

## 2017-08-27 LAB — D-DIMER, QUANTITATIVE: D-Dimer, Quant: 1.04 ug/mL-FEU — ABNORMAL HIGH (ref 0.00–0.50)

## 2017-08-27 MED ORDER — BISACODYL 10 MG RE SUPP: 10.0000 mg | Freq: Every day | RECTAL | Status: DC | PRN

## 2017-08-27 MED ORDER — NALOXONE HCL 0.4 MG/ML IJ SOLN: 0.4000 mg | INTRAMUSCULAR | Status: DC | PRN

## 2017-08-27 MED ORDER — ENOXAPARIN SODIUM 40 MG/0.4ML ~~LOC~~ SOLN
40.0000 mg | SUBCUTANEOUS | Status: DC
  Administered 2017-08-27 – 2017-08-29 (×2): 40 mg via SUBCUTANEOUS
  Filled 2017-08-27 (×5): qty 0.4

## 2017-08-27 MED ORDER — IOPAMIDOL (ISOVUE-370) INJECTION 76%
100.0000 mL | Freq: Once | INTRAVENOUS | Status: AC | PRN
  Administered 2017-08-27: 77 mL via INTRAVENOUS

## 2017-08-27 MED ORDER — HYDROMORPHONE HCL 1 MG/ML IJ SOLN
1.0000 mg | Freq: Once | INTRAMUSCULAR | Status: AC
  Administered 2017-08-27: 1 mg via INTRAVENOUS
  Filled 2017-08-27: qty 1

## 2017-08-27 MED ORDER — DEXTROSE-NACL 5-0.45 % IV SOLN
INTRAVENOUS | Status: DC
  Administered 2017-08-27: 22:00:00 via INTRAVENOUS

## 2017-08-27 MED ORDER — SODIUM CHLORIDE 0.9% FLUSH: 9.0000 mL | INTRAVENOUS | Status: DC | PRN

## 2017-08-27 MED ORDER — KETOROLAC TROMETHAMINE 30 MG/ML IJ SOLN
30.0000 mg | Freq: Once | INTRAMUSCULAR | Status: AC
  Administered 2017-08-27: 30 mg via INTRAVENOUS
  Filled 2017-08-27: qty 1

## 2017-08-27 MED ORDER — SENNOSIDES-DOCUSATE SODIUM 8.6-50 MG PO TABS
1.0000 | ORAL_TABLET | Freq: Two times a day (BID) | ORAL | Status: DC
  Administered 2017-08-27 – 2017-09-02 (×11): 1 via ORAL
  Filled 2017-08-27 (×12): qty 1

## 2017-08-27 MED ORDER — HYDROMORPHONE 1 MG/ML IV SOLN
INTRAVENOUS | Status: DC
  Administered 2017-08-27: 22:00:00 via INTRAVENOUS
  Administered 2017-08-28: 2.1 mg via INTRAVENOUS
  Administered 2017-08-28: 2.3 mg via INTRAVENOUS
  Administered 2017-08-28: 1.5 mg via INTRAVENOUS
  Filled 2017-08-27: qty 25

## 2017-08-27 MED ORDER — ONDANSETRON HCL 4 MG PO TABS
4.0000 mg | ORAL_TABLET | ORAL | Status: DC | PRN
  Administered 2017-08-28: 4 mg via ORAL
  Filled 2017-08-27: qty 1

## 2017-08-27 MED ORDER — SODIUM CHLORIDE 0.45 % IV SOLN
INTRAVENOUS | Status: DC
  Administered 2017-08-27 – 2017-09-02 (×7): via INTRAVENOUS

## 2017-08-27 MED ORDER — HYDROXYZINE HCL 25 MG PO TABS: 25.0000 mg | ORAL_TABLET | ORAL | Status: DC | PRN

## 2017-08-27 MED ORDER — POLYETHYLENE GLYCOL 3350 17 G PO PACK: 17.0000 g | PACK | Freq: Every day | ORAL | Status: DC | PRN

## 2017-08-27 MED ORDER — HYDROMORPHONE HCL 1 MG/ML IJ SOLN
1.0000 mg | Freq: Once | INTRAMUSCULAR | Status: AC
Start: 2017-08-27 — End: 2017-08-27
  Administered 2017-08-27: 1 mg via INTRAVENOUS
  Filled 2017-08-27: qty 1

## 2017-08-27 MED ORDER — KETOROLAC TROMETHAMINE 30 MG/ML IJ SOLN
30.0000 mg | Freq: Four times a day (QID) | INTRAMUSCULAR | Status: AC
  Administered 2017-08-28 – 2017-09-01 (×20): 30 mg via INTRAVENOUS
  Filled 2017-08-27 (×20): qty 1

## 2017-08-27 MED ORDER — ONDANSETRON HCL 4 MG/2ML IJ SOLN
4.0000 mg | INTRAMUSCULAR | Status: DC | PRN
  Administered 2017-08-31 – 2017-09-01 (×2): 4 mg via INTRAVENOUS
  Filled 2017-08-27 (×2): qty 2

## 2017-08-27 NOTE — ED Notes (Signed)
Pt on monitor 

## 2017-08-27 NOTE — ED Notes (Signed)
Pt's family pressed call bell, stating that monitor was beeping. Advised family that oxygen sensor wasn't completely on patients finger. Patients mother at bedside, asking numerous questions en reference to patients care. Pt thrashing about on bed. Will make PA made aware. Primary RN, Deanna ArtisKeisha made aware.

## 2017-08-27 NOTE — H&P (Signed)
Stacy [name] ZOX:[id number] DOB: 03/26/1991 DOA: 08/27/2017     PCP: System, Pcp Not In   Outpatient Specialists: Newman Pies hem/Onc PA [facility]   Patient arrived to ER on 08/27/17 at 1325  Patient coming from:  home Lives  With family    Chief Complaint:  Chief Complaint  Patient presents with  . Chest Pain    HPI: Stacy [name] is a 27 y.o. female with medical history significant of sickle cell disease, type [place] followed by [facility] hematology, left hip avascular necrosis  Presented with   chest pain central since this morning when she woke up. States initially was different from prior sickle cell episodes.  Patient states that she does not take pain medications on a regular basis.  Usually her sickle cell crisis affects lower extremities.  Describes pain as throbbing and nonradiating not associated with any shortness of breath.  Patient was seen in the emergency department 26 May for fevers and was found to have UTI.  She has no prior history of DVT or PE. Pain slightly worse with palpation did not get better with oxycodone only slightly better with Dilaudid IV 6 May patient presented to emergency department with fever and generalized body aches and sore throat strep rapid strep was negative blood culture unremarkable she was found to have UTI and was given dose of Rocephin and then prescription was called in for  Keflex. Unfortunately urine culture was not obtained at the time     While in ER: CTA chest negative for PE EKG and troponin unremarkable  Following Medications were ordered in ER: Medications  0.45 % sodium chloride infusion ( Intravenous Transfusing/Transfer 08/27/17 1940)  HYDROmorphone (DILAUDID) injection 1 mg (1 mg Intravenous Given 08/27/17 1431)  HYDROmorphone (DILAUDID) injection 1 mg (1 mg Intravenous Given 08/27/17 1515)  iopamidol (ISOVUE-370) 76 % injection 100 mL (77 mLs Intravenous Contrast Given 08/27/17 1543)  HYDROmorphone (DILAUDID) injection 1  mg (1 mg Intravenous Given 08/27/17 1657)  ketorolac (TORADOL) 30 MG/ML injection 30 mg (30 mg Intravenous Given 08/27/17 1936)    Significant initial  Findings: Abnormal Labs Reviewed  CBC WITH DIFFERENTIAL/PLATELET - Abnormal; Notable for the following components:      Result Value   RBC 3.83 (*)    Hemoglobin 10.5 (*)    HCT 28.2 (*)    MCV 73.6 (*)    MCHC 37.2 (*)    RDW 17.0 (*)    All other components within normal limits  D-DIMER, QUANTITATIVE (NOT AT [facility]) - Abnormal; Notable for the following components:   D-Dimer, Quant 1.04 (*)    All other components within normal limits  RETICULOCYTES - Abnormal; Notable for the following components:   Retic Ct Pct 5.3 (*)    Retic Count, Absolute 208.3 (*)    All other components within normal limits  URINALYSIS, ROUTINE W REFLEX MICROSCOPIC - Abnormal; Notable for the following components:   APPearance HAZY (*)    Specific Gravity, Urine <1.005 (*)    All other components within normal limits     Na 8.9 K 3.8  Cr   Stable,  Lab Results  Component Value Date   CREATININE 0.72 08/27/2017   CREATININE 0.75 08/21/2017   CREATININE 0.51 05/14/2014      WBC  8.9  HG/HCT stable,       Component Value Date/Time   HGB 10.5 (L) 08/27/2017 1442   HCT 28.2 (L) 08/27/2017 1442     Alb 4.1  Troponin (Point of Care Test) No results for input(s): TROPIPOC in the last 72 hours.   UA  no evidence of UTI    CT HEAD * NON acute  CXR - NON acute  CTA chest -  No PE nonacute  ECG:  Personally reviewed by me showing: HR : 59 Rhythm: *NSR, A.fib. LBBB, Paced  nonspecific changes, inverted T waves in lead V1 V2  QTC 448    ED Triage Vitals  Enc Vitals Group     BP 08/27/17 1331 (!) 167/109     Pulse Rate 08/27/17 1331 (!) 58     Resp 08/27/17 1331 20     Temp 08/27/17 1332 98.1 F (36.7 C)     Temp Source 08/27/17 1332 Oral     SpO2 08/27/17 1331 99 %     Weight 08/27/17 1329 170 lb (77.1 kg)     Height 08/27/17  1329 5' (1.524 m)     Head Circumference --      Peak Flow --      Pain Score 08/27/17 1329 10     Pain Loc --      Pain Edu? --      Excl. in GC? --   TMAX(24)@       Latest  Blood pressure (!) 100/55, pulse 81, temperature 98.1 F (36.7 C), temperature source Oral, resp. rate 11, height 5' (1.524 m), weight 77.1 kg (170 lb), last menstrual period 08/25/2017, SpO2 97 %, unknown if currently breastfeeding.  Hospitalist was called for admission for chest pain in the setting of sickle cell disease possibly sickle cell crisis   Review of Systems:    Pertinent positives include:  Fevers, chills, fatigue, chest pain,   Constitutional:  No weight loss, night sweats,, weight loss  HEENT:  No headaches, Difficulty swallowing,Tooth/dental problems,Sore throat,  No sneezing, itching, ear ache, nasal congestion, post nasal drip,  Cardio-vascular:  No Orthopnea, PND, anasarca, dizziness, palpitations.no Bilateral lower extremity swelling  GI:  No heartburn, indigestion, abdominal pain, nausea, vomiting, diarrhea, change in bowel habits, loss of appetite, melena, blood in stool, hematemesis Resp:  no shortness of breath at rest. No dyspnea on exertion, No excess mucus, no productive cough, No non-productive cough, No coughing up of blood.No change in color of mucus.No wheezing. Skin:  no rash or lesions. No jaundice GU:  no dysuria, change in color of urine, no urgency or frequency. No straining to urinate.  No flank pain.  Musculoskeletal:  No joint pain or no joint swelling. No decreased range of motion. No back pain.  Psych:  No change in mood or affect. No depression or anxiety. No memory loss.  Neuro: no localizing neurological complaints, no tingling, no weakness, no double vision, no gait abnormality, no slurred speech, no confusion  As per HPI otherwise 10 point review of systems negative.   Past Medical History:   Past Medical History:  Diagnosis Date  . Sickle cell anemia  (HCC)       Past Surgical History:  Procedure Laterality Date  . CESAREAN SECTION      Social History:  Ambulatory   independently      reports that she has never smoked. She has never used smokeless tobacco. She reports that she does not drink alcohol or use drugs.     Family History:   Family History  Problem Relation Age of Onset  . Stroke Other   . CAD Other   . Sickle cell anemia Other   .  Diabetes Neg Hx     Allergies: Allergies  Allergen Reactions  . Demerol [Meperidine] Other (See Comments)    Seizures     Prior to Admission medications   Medication Sig Start Date End Date Taking? Authorizing Provider  oxyCODONE (ROXICODONE) 15 MG immediate release tablet Take 15 mg by mouth every 4 (four) hours as needed for pain.   Yes [provider]  HYDROcodone-homatropine (HYCODAN) 5-1.5 MG/5ML syrup Take 5 mLs by mouth every 6 (six) hours as needed for cough. 03/17/16   Cheri Fowler, PA-C   Physical Exam: Blood pressure (!) 100/55, pulse 81, temperature 98.1 F (36.7 C), temperature source Oral, resp. rate 11, height 5' (1.524 m), weight 77.1 kg (170 lb), last menstrual period 08/25/2017, SpO2 97 %, unknown if currently breastfeeding. 1. General:  in No Acute distress  Chronically ill -appearing 2. Psychological: Alert and  Oriented 3. Head/ENT:    Dry Mucous Membranes                          Head Non traumatic, neck supple                          Normal  Dentition 4. SKIN:  decreased Skin turgor,  Skin clean Dry and intact no rash 5. Heart: Regular rate and rhythm no  Murmur, no Rub or gallop 6. Lungs: Clear to auscultation bilaterally, no wheezes or crackles   7. Abdomen: Soft,  non-tender, Non distended   Obese bowel sounds present 8. Lower extremities: no clubbing, cyanosis, or edema 9. Neurologically Grossly intact, moving all 4 extremities equally  10. MSK: Normal range of motion   LABS:     Recent Labs  Lab 08/21/17 2321 08/27/17 1442    WBC 11.3* 8.9  NEUTROABS 9.4* 4.8  HGB 9.9* 10.5*  HCT 25.3* 28.2*  MCV 77.4* 73.6*  PLT 144* 293   Basic Metabolic Panel: Recent Labs  Lab 08/21/17 2321 08/27/17 1442  NA 139 138  K 3.5 3.8  CL 107 107  CO2 23 24  GLUCOSE 99 94  BUN 8 7  CREATININE 0.75 0.72  CALCIUM 8.5* 8.9      Recent Labs  Lab 08/27/17 1442  AST 20  ALT 21  ALKPHOS 50  BILITOT 0.5  PROT 7.4  ALBUMIN 4.1   No results for input(s): LIPASE, AMYLASE in the last 168 hours. No results for input(s): AMMONIA in the last 168 hours.    HbA1C: No results for input(s): HGBA1C in the last 72 hours. CBG: No results for input(s): GLUCAP in the last 168 hours.    Urine analysis:    Component Value Date/Time   COLORURINE YELLOW 08/27/2017 2002   APPEARANCEUR HAZY (A) 08/27/2017 2002   LABSPEC <1.005 (L) 08/27/2017 2002   PHURINE 5.5 08/27/2017 2002   GLUCOSEU NEGATIVE 08/27/2017 2002   HGBUR NEGATIVE 08/27/2017 2002   BILIRUBINUR NEGATIVE 08/27/2017 2002   KETONESUR NEGATIVE 08/27/2017 2002   PROTEINUR NEGATIVE 08/27/2017 2002   UROBILINOGEN 1.0 05/14/2014 0905   NITRITE NEGATIVE 08/27/2017 2002   LEUKOCYTESUR NEGATIVE 08/27/2017 2002       Cultures:    Component Value Date/Time   SDES  08/22/2017 0147    BLOOD RIGHT ANTECUBITAL Performed at [facility], [address], [place], [place] [address]    [facility]  08/22/2017 0147    BOTTLES DRAWN AEROBIC AND ANAEROBIC Blood Culture adequate volume  Performed at [facility] [place], [address] [place], [place] [address]    CULT  08/22/2017 0147    NO GROWTH 5 DAYS Performed at [facility], [address] [place], [place] [address]    REPTSTATUS 08/27/2017 FINAL 08/22/2017 0147     Radiological Exams on Admission: Dg Chest 2 View  Result Date: 08/27/2017 CLINICAL DATA:  Mid chest pain today. EXAM: CHEST - 2 VIEW COMPARISON:  08/21/2017 FINDINGS: The cardiomediastinal silhouette is within normal limits.  The lungs are clear. No pleural effusion or pneumothorax is identified. No acute osseous abnormality is seen. IMPRESSION: No active cardiopulmonary disease. Electronically Signed   By: Sebastian AcheAllen  Grady M.D.   On: 08/27/2017 14:33   Ct Angio Chest Pe W/cm &/or Wo Cm  Result Date: 08/27/2017 CLINICAL DATA:  Sickle cell patient with mid sternal chest pain beginning today. EXAM: CT ANGIOGRAPHY CHEST WITH CONTRAST TECHNIQUE: Multidetector CT imaging of the chest was performed using the standard protocol during bolus administration of intravenous contrast. Multiplanar CT image reconstructions and MIPs were obtained to evaluate the vascular anatomy. CONTRAST:  77mL ISOVUE-370 IOPAMIDOL (ISOVUE-370) INJECTION 76% COMPARISON:  Chest radiography 08/21/2017.  CT 09/07/2014. FINDINGS: Cardiovascular: Pulmonary arterial opacification is moderate to good. No pulmonary emboli. No aortic pathology. Mild cardiac enlargement. No pericardial effusion. Mediastinum/Nodes: Normal Lungs/Pleura: Very small pleural effusions layering dependently. The lungs are clear except for a small patchy density at the right apex and in the superior segment of the left lower lobe, most likely to represent scarring. No sign of consolidation or collapse. Upper Abdomen: Normal Musculoskeletal: No acute finding Review of the MIP images confirms the above findings. IMPRESSION: No pulmonary emboli. Tiny pleural effusions layering dependently. Small scars at the right apex and in the superior segment of the right lower lobe. Electronically Signed   By: Paulina FusiMark  Shogry M.D.   On: 08/27/2017 16:07    Chart has been reviewed    Assessment/Plan   27 y.o. female with medical history significant of sickle cell disease, type [place] [facility] by [facility], left hip avascular necrosis  Admitted for chest pain setting of sickle cell disease and possibly sickle cell crisis  Present on Admission: . Sickle cell crisis (HCC) - - will admit per sickle cell  protocol,    control pain,    hydrate with IVF D5 .45% Saline @ 100 mls/hour,    Weight based Dilaudid PCA for opioid naive patients.    continue hydroxyurea and folic acid   Transfuse as needed if Hg drops significantly below baseline.    No evidence of acute chest at this time   Sickle cell team to take over management in AM  . Chest pain - atypical CTA negative for PE, no evidence of positional pain or ECG changes to suggest pericarditis. Cycle CE, obtain echo possibly related to sickle cell disease.   Recent UTI - UA WNL non symptomatic. Had 3 days between PO and IV Antibiotics.  Other plan as per orders.  DVT prophylaxis:    Lovenox     Code Status:  FULL CODE  as per patient  I had personally discussed CODE STATUS with patient and family  Family Communication:   Family   at  Bedside  plan of care was discussed with  mother  Disposition Plan:     To home once workup is complete and patient is stable  Consults called: none   Admission status:   inpatient     Level of care     tele            Therisa Doyne 08/27/2017, 9:36 PM    [facility]  Pager [phone]   after 2 AM please page floor coverage PA If 7AM-7PM, please contact the day team taking care of the patient  [email or web address]  Password [email or web address]

## 2017-08-27 NOTE — ED Notes (Signed)
Spoke with mom of pt. Updated on plan of care. Awaiting ED provider to talk with pt and family.

## 2017-08-27 NOTE — ED Triage Notes (Addendum)
Central chest pain since this morning when she woke up. Was [facility] and had blood work done but left before being seen. Pt reports hx of sickle cell but states this does not feel like her normal sickle cell crisis.

## 2017-08-27 NOTE — ED Provider Notes (Signed)
[facility] EMERGENCY DEPARTMENT Provider Note   CSN: [id number] Arrival date & time: 08/27/17  1325     History   Chief Complaint Chief Complaint  Patient presents with  . Chest Pain    HPI Stacy [name] is a 27 y.o. female.  HPI  Patient is a 27 year old female with a history of sickle cell disease, type [place], [facility] (Dr. Lowell GuitarPowell)  presenting for chest pain.  Patient reports that she woke up suddenly with the pain this morning, and it is atypical of any other sickle cell pain she has had previously.  Patient reports that she is infrequently hospitalized for pain crises, and takes no chronic pain  medication daily.  Patient reports that pain crisis typically affect her extremities.  Patient describes the pain as throbbing, nonradiating, and constant.  Patient denies shortness of breath with the chest pain.  Patient was recently ill with respiratory illness and subjective fevers earlier in the week, but currently denies any fevers, cough, shortness of breath, nausea, or vomiting, abdominal pain, or dysuria.  Patient denies any recent immobilization, hospitalization, recent surgery, estrogen use, or history of DVT/PE.  Patient has been taking Keflex for urinary tract infection diagnosed on 08-21-2017.  Patient reports taking 1 dose of oxycodone at 10 AM today, which provided no relief.   Past Medical History:  Diagnosis Date  . Sickle cell anemia (HCC)     There are no active problems to display for this patient.   Past Surgical History:  Procedure Laterality Date  . CESAREAN SECTION       OB History    Gravida  1   Para      Term      Preterm      AB      Living        SAB      TAB      Ectopic      Multiple      Live Births               Home Medications    Prior to Admission medications   Medication Sig Start Date End Date Taking? Authorizing Provider  oxyCODONE (ROXICODONE) 15 MG immediate release  tablet Take 15 mg by mouth every 4 (four) hours as needed for pain.   Yes [provider]  HYDROcodone-homatropine (HYCODAN) 5-1.5 MG/5ML syrup Take 5 mLs by mouth every 6 (six) hours as needed for cough. 03/17/16   Cheri Fowlerose, Kayla, PA-C    Family History No family history on file.  Social History Social History   Tobacco Use  . Smoking status: Never Smoker  . Smokeless tobacco: Never Used  Substance Use Topics  . Alcohol use: No  . Drug use: No     Allergies   Demerol [meperidine]   Review of Systems Review of Systems  Constitutional: Negative for chills and fever.  HENT: Negative for congestion and rhinorrhea.   Respiratory: Positive for chest tightness. Negative for shortness of breath.   Cardiovascular: Positive for chest pain. Negative for palpitations and leg swelling.  Gastrointestinal: Negative for abdominal pain, nausea and vomiting.  Genitourinary: Negative for dysuria and flank pain.  Musculoskeletal: Negative for arthralgias, back pain and myalgias.  Skin: Negative for rash.  Neurological: Negative for syncope, weakness and numbness.  All other systems reviewed and are negative.    Physical Exam Updated Vital Signs BP (!) 139/101   Pulse (!) 58   Temp 98.1  F (36.7 C) (Oral)   Resp 12   Ht 5' (1.524 m)   Wt 77.1 kg (170 lb)   LMP 08/25/2017   SpO2 99%   BMI 33.20 kg/m   Physical Exam  Constitutional: She appears well-developed and well-nourished. No distress.  HENT:  Head: Normocephalic and atraumatic.  Mouth/Throat: Oropharynx is clear and moist.  Eyes: Pupils are equal, round, and reactive to light. Conjunctivae and EOM are normal.  Neck: Normal range of motion. Neck supple.  Cardiovascular: Normal rate, regular rhythm, S1 normal and S2 normal.  No murmur heard. Pulses:      Radial pulses are 2+ on the right side, and 2+ on the left side.  Bradycardia noted. No lower extremity edema.  Pulmonary/Chest: Effort normal and breath sounds  normal. She has no wheezes. She has no rales.  Abdominal: Soft. She exhibits no distension. There is no tenderness. There is no guarding.  Musculoskeletal: Normal range of motion. She exhibits no edema or deformity.  Lymphadenopathy:    She has no cervical adenopathy.  Neurological: She is alert.  Cranial nerves grossly intact. Patient moves extremities symmetrically and with good coordination.  Skin: Skin is warm and dry. No rash noted. No erythema.  Psychiatric: She has a normal mood and affect. Her behavior is normal. Judgment and thought content normal.  Nursing note and vitals reviewed.    ED Treatments / Results  Labs (all labs ordered are listed, but only abnormal results are displayed) Labs Reviewed  PREGNANCY, URINE  CBC WITH DIFFERENTIAL/PLATELET  COMPREHENSIVE METABOLIC PANEL  TROPONIN I  D-DIMER, QUANTITATIVE (NOT AT [facility])  RETICULOCYTES    EKG EKG Interpretation  Date/Time:  Saturday August 27 2017 13:33:53 EDT Ventricular Rate:  59 PR Interval:    QRS Duration: 96 QT Interval:  452 QTC Calculation: 448 R Axis:   54 Text Interpretation:  inverted T waves in V1 V2. Confirmed by Bary Castilla ([id number]) on 08/27/2017 1:57:09 PM   Radiology Dg Chest 2 View  Result Date: 08/27/2017 CLINICAL DATA:  Mid chest pain today. EXAM: CHEST - 2 VIEW COMPARISON:  08/21/2017 FINDINGS: The cardiomediastinal silhouette is within normal limits. The lungs are clear. No pleural effusion or pneumothorax is identified. No acute osseous abnormality is seen. IMPRESSION: No active cardiopulmonary disease. Electronically Signed   By: Sebastian Ache M.D.   On: 08/27/2017 14:33   Ct Angio Chest Pe W/cm &/or Wo Cm  Result Date: 08/27/2017 CLINICAL DATA:  Sickle cell patient with mid sternal chest pain beginning today. EXAM: CT ANGIOGRAPHY CHEST WITH CONTRAST TECHNIQUE: Multidetector CT imaging of the chest was performed using the standard protocol during bolus administration of intravenous  contrast. Multiplanar CT image reconstructions and MIPs were obtained to evaluate the vascular anatomy. CONTRAST:  77mL ISOVUE-370 IOPAMIDOL (ISOVUE-370) INJECTION 76% COMPARISON:  Chest radiography 08/21/2017.  CT 09/07/2014. FINDINGS: Cardiovascular: Pulmonary arterial opacification is moderate to good. No pulmonary emboli. No aortic pathology. Mild cardiac enlargement. No pericardial effusion. Mediastinum/Nodes: Normal Lungs/Pleura: Very small pleural effusions layering dependently. The lungs are clear except for a small patchy density at the right apex and in the superior segment of the left lower lobe, most likely to represent scarring. No sign of consolidation or collapse. Upper Abdomen: Normal Musculoskeletal: No acute finding Review of the MIP images confirms the above findings. IMPRESSION: No pulmonary emboli. Tiny pleural effusions layering dependently. Small scars at the right apex and in the superior segment of the right lower lobe. Electronically Signed   By: Loraine Leriche  Shogry M.D.   On: 08/27/2017 16:07    Procedures Procedures (including critical care time)  Medications Ordered in ED Medications  0.45 % sodium chloride infusion ( Intravenous New Bag/Given 08/27/17 1439)  HYDROmorphone (DILAUDID) injection 1 mg (1 mg Intravenous Given 08/27/17 1431)     Initial Impression / Assessment and Plan / ED Course  I have reviewed the triage vital signs and the nursing notes.  Pertinent labs & imaging results that were available during my care of the patient were reviewed by me and considered in my medical decision making (see chart for details).  Clinical Course as of Aug 28 144  Sat Aug 27, 2017  1510 Case discussed with Dr. Bary Castilla.   [AM]  [phone] Spoke with Dr. Catha Gosselin of [facility] who will admit the patient.  Appreciate [facility] hospital[facility] involvement in the care of this patient.  P[facility] line called to discuss the availability of bed the patient could go there, preferentially as this is where  her primary hematology team is located.  No beds are available at [facility] at this time.   [AM]    Clinical Course User Index [AM] Elisha Ponder, PA-C    Patient nontoxic-appearing, afebrile, he dynamically stable, but significantly uncomfortable.  Patient describing atypical pain.  On review patient's chart, patient has minimal prior history of sickle cell pain crisis, rarely affecting chest.  Given patient's hemoglobin genetics, patient is not at high risk for sickle cell crisis.  Differential diagnosis includes pulmonary embolism, ACS, myocarditis, pericarditis given recent respiratory illness.  Doubt thoracic aortic dissection, his chest pain is nonradiating, patient has equal pulses in bilateral upper extremities and lower extremities, and no family history of vascular disease.  Doubt acute chest, as patient is afebrile, and has a normal chest x-ray.  Troponin x1 negative. Patient's hemoglobin is at baseline.  Patient does exhibit a reticulocytosis compared with 6 days ago when she presented to the emergency department with respiratory illness.  Reticulocyte count 5.3 and absolute count 208.  Patient had elevated d-dimer, but negative CTPA for pulmonary embolism.  Also demonstrates no evidence of pericardial effusion.  Patient also has no signs on EKG consistent with pericarditis.  Patient does have tiny pleural effusions bilaterally on CTPA, but no evidence of clear infiltrate, and clinically does not appear to have acute respiratory illness.  Given the negative cardiopulmonary work-up  Patient had minimal control x3 doses of Dilaudid.  Awaiting hCG, and then will provide Toradol.  Patient to be admitted to [facility]   Final Clinical Impressions(s) / ED Diagnoses   Final diagnoses:  Central chest pain  Reticulocytosis    ED Discharge Orders    None       Delia Chimes 08/28/17 0146    Abelino Derrick, MD 08/28/17 [phone]

## 2017-08-27 NOTE — Progress Notes (Signed)
27 year old female with a history of sickle cell disease, type [place] [facility].  Recently patient was treated for an upper respiratory infection as well as a urinary tract infection.  She completed Keflex.  Patient has been taking pain medication without relief.  EDP noted the patient had elevated d-dimer, CTA was negative for PE.  EKG and troponin also unremarkable.  She [facility] to [facility] at [facility], observation.  She should be seen by sickle cell team on 08/28/2017.  Corrisa Gibby D.O. [facility] Pager [phone]  If 7PM-7AM, please contact night-coverage [email or web address] Password [email or web address] 08/27/2017, 5:46 PM

## 2017-08-27 NOTE — ED Notes (Signed)
Patient transported to X-ray 

## 2017-08-28 ENCOUNTER — Encounter (HOSPITAL_COMMUNITY): Payer: Self-pay

## 2017-08-28 DIAGNOSIS — R701 Abnormal plasma viscosity: Secondary | ICD-10-CM

## 2017-08-28 LAB — CBC WITH DIFFERENTIAL/PLATELET
Basophils Absolute: 0 10*3/uL (ref 0.0–0.1)
Basophils Relative: 0 %
EOS ABS: 0 10*3/uL (ref 0.0–0.7)
EOS PCT: 0 %
HCT: 24.3 % — ABNORMAL LOW (ref 36.0–46.0)
Hemoglobin: 8.6 g/dL — ABNORMAL LOW (ref 12.0–15.0)
LYMPHS ABS: 1.7 10*3/uL (ref 0.7–4.0)
Lymphocytes Relative: 25 %
MCH: 26.4 pg (ref 26.0–34.0)
MCHC: 35.4 g/dL (ref 30.0–36.0)
MCV: 74.5 fL — ABNORMAL LOW (ref 78.0–100.0)
Monocytes Absolute: 0.5 10*3/uL (ref 0.1–1.0)
Monocytes Relative: 7 %
Neutro Abs: 4.8 10*3/uL (ref 1.7–7.7)
Neutrophils Relative %: 68 %
PLATELETS: 173 10*3/uL (ref 150–400)
RBC: 3.26 MIL/uL — AB (ref 3.87–5.11)
RDW: 16.9 % — AB (ref 11.5–15.5)
WBC: 7.1 10*3/uL (ref 4.0–10.5)

## 2017-08-28 LAB — TROPONIN I
Troponin I: <0.03 ng/mL (ref <0.03)
Troponin I: <0.03 ng/mL (ref <0.03)

## 2017-08-28 LAB — HIV ANTIBODY (ROUTINE TESTING W REFLEX): HIV Screen 4th Generation wRfx: NONREACTIVE

## 2017-08-28 LAB — MAGNESIUM: Magnesium: 1.8 mg/dL (ref 1.7–2.4)

## 2017-08-28 LAB — PHOSPHORUS: PHOSPHORUS: 4.6 mg/dL (ref 2.5–4.6)

## 2017-08-28 MED ORDER — HYDROMORPHONE 1 MG/ML IV SOLN
INTRAVENOUS | Status: DC
  Administered 2017-08-28: 1.2 mg via INTRAVENOUS
  Administered 2017-08-28: 0.6 mg via INTRAVENOUS
  Administered 2017-08-28: 4.2 mg via INTRAVENOUS
  Administered 2017-08-29: 1.2 mg via INTRAVENOUS
  Administered 2017-08-29: 04:00:00 via INTRAVENOUS
  Administered 2017-08-29: 2.4 mg via INTRAVENOUS
  Administered 2017-08-29: 3.6 mg via INTRAVENOUS
  Administered 2017-08-29: 1.8 mg via INTRAVENOUS
  Administered 2017-08-30: 2.4 mg via INTRAVENOUS
  Administered 2017-08-30: 3.6 mg via INTRAVENOUS
  Administered 2017-08-30: 1.2 mg via INTRAVENOUS
  Administered 2017-08-30: 3 mg via INTRAVENOUS
  Administered 2017-08-30: 4.2 mg via INTRAVENOUS
  Administered 2017-08-30: 12:00:00 via INTRAVENOUS
  Administered 2017-08-30: 2.4 mg via INTRAVENOUS
  Administered 2017-08-30: 4.2 mg via INTRAVENOUS
  Administered 2017-08-31: 3.6 mg via INTRAVENOUS
  Administered 2017-08-31: 5.4 mg via INTRAVENOUS
  Administered 2017-08-31: 21:00:00 via INTRAVENOUS
  Administered 2017-08-31: 1.2 mg via INTRAVENOUS
  Administered 2017-08-31: 4.8 mg via INTRAVENOUS
  Administered 2017-08-31 – 2017-09-01 (×2): 2.4 mg via INTRAVENOUS
  Administered 2017-09-01: 0.6 mg via INTRAVENOUS
  Administered 2017-09-01: 2.4 mg via INTRAVENOUS
  Administered 2017-09-01: 1.8 mg via INTRAVENOUS
  Administered 2017-09-01: 6 mg via INTRAVENOUS
  Administered 2017-09-01: 1.8 mg via INTRAVENOUS
  Administered 2017-09-02: 0.6 mg via INTRAVENOUS
  Administered 2017-09-02: 0 mg via INTRAVENOUS
  Administered 2017-09-02: 0.6 mg via INTRAVENOUS
  Filled 2017-08-28 (×3): qty 25

## 2017-08-28 MED ORDER — HYDROMORPHONE 1 MG/ML IV SOLN
INTRAVENOUS | Status: DC
  Administered 2017-08-28: 1.8 mg via INTRAVENOUS

## 2017-08-28 MED ORDER — OXYCODONE HCL 5 MG PO TABS
15.0000 mg | ORAL_TABLET | ORAL | Status: DC | PRN
  Administered 2017-08-30 – 2017-09-01 (×7): 15 mg via ORAL
  Filled 2017-08-28 (×7): qty 3

## 2017-08-28 MED ORDER — SODIUM CHLORIDE 0.9 % IV SOLN
25.0000 mg | INTRAVENOUS | Status: DC | PRN
  Filled 2017-08-28: qty 0.5

## 2017-08-28 MED ORDER — SODIUM CHLORIDE 0.9% FLUSH: 9.0000 mL | INTRAVENOUS | Status: DC | PRN

## 2017-08-28 MED ORDER — DIPHENHYDRAMINE HCL 25 MG PO CAPS
25.0000 mg | ORAL_CAPSULE | ORAL | Status: DC | PRN
  Administered 2017-08-30 – 2017-09-01 (×3): 25 mg via ORAL
  Filled 2017-08-28 (×3): qty 1

## 2017-08-28 MED ORDER — NALOXONE HCL 0.4 MG/ML IJ SOLN: 0.4000 mg | INTRAMUSCULAR | Status: DC | PRN

## 2017-08-28 NOTE — Progress Notes (Signed)
Patient ID: Stacy [name], female   DOB: 1991-02-18, 27 y.o.   MRN: [id number] Subjective:  Stacy [name] is a 27 y.o. female with medical history significant of sickle cell disease, type [place] followed by [facility] hematology, left hip avascular necrosis admitted for pain crisis.  Patient still complaining of significant pain in her chest and lower back. She does not have cough, no SOB, no fever, no joint swelling or redness.    Objective:  Vital signs in last 24 hours:  Vitals:   08/28/17 0423 08/28/17 0519 08/28/17 0843 08/28/17 1000  BP:  (!) 134/96    Pulse:  64    Resp: 17 11 11 11   Temp:      TempSrc:      SpO2: 100% 100% 100% 100%  Weight:      Height:       Intake/Output from previous day:   Intake/Output Summary (Last 24 hours) at 08/28/2017 1121 Last data filed at 08/28/2017 0500 Gross per 24 hour  Intake 2151.67 ml  Output -  Net 2151.67 ml   Physical Exam: General: Alert, awake, oriented x3, in no acute distress. Obese HEENT: [place]/AT PEERL, EOMI Neck: Trachea midline,  no masses, no thyromegal,y no JVD, no carotid bruit OROPHARYNX:  Moist, No exudate/ erythema/lesions.  Heart: Regular rate and rhythm, without murmurs, rubs, gallops, PMI non-displaced, no heaves or thrills on palpation.  Lungs: Clear to auscultation, no wheezing or rhonchi noted. No increased vocal fremitus resonant to percussion  Abdomen: Soft, nontender, nondistended, positive bowel sounds, no masses no hepatosplenomegaly noted..  Neuro: No focal neurological deficits noted cranial nerves II through XII grossly intact. DTRs 2+ bilaterally upper and lower extremities. Strength 5 out of 5 in bilateral upper and lower extremities. Musculoskeletal: No warm swelling or erythema around joints, no spinal tenderness noted. Psychiatric: Patient alert and oriented x3, good insight and cognition, good recent to remote recall. Lymph node survey: No cervical axillary or inguinal lymphadenopathy noted.  Lab  Results:  Basic Metabolic Panel:    Component Value Date/Time   NA 138 08/27/2017 1442   K 3.8 08/27/2017 1442   CL 107 08/27/2017 1442   CO2 24 08/27/2017 1442   BUN 7 08/27/2017 1442   CREATININE 0.72 08/27/2017 1442   GLUCOSE 94 08/27/2017 1442   CALCIUM 8.9 08/27/2017 1442   CBC:    Component Value Date/Time   WBC 7.1 08/28/2017 0332   HGB 8.6 (L) 08/28/2017 0332   HCT 24.3 (L) 08/28/2017 0332   PLT 173 08/28/2017 0332   MCV 74.5 (L) 08/28/2017 0332   NEUTROABS 4.8 08/28/2017 0332   LYMPHSABS 1.7 08/28/2017 0332   MONOABS 0.5 08/28/2017 0332   EOSABS 0.0 08/28/2017 0332   BASOSABS 0.0 08/28/2017 0332    Recent Results (from the past 240 hour(s))  Rapid Strep Screen ([facility] ONLY)     Status: None   Collection Time: 08/22/17 12:02 AM  Result Value Ref Range Status   Streptococcus, Group A Screen (Direct) NEGATIVE NEGATIVE Final    Comment: (NOTE) A Rapid Antigen test may result negative if the antigen level in the sample is below the detection level of this test. The FDA has not cleared this test as a stand-alone test therefore the rapid antigen negative result has reflexed to a Group A Strep culture. Performed at [facility], [address] [place], [place] [address]   Culture, group A strep     Status: None  Collection Time: 08/22/17 12:02 AM  Result Value Ref Range Status   Specimen Description   Final    THROAT Performed at [facility] [place], [address] [place], [place] [address]    Special Requests   Final    NONE Reflexed from [id number] Performed at [facility] [place], [address] [place], [place] [address]    Culture   Final    NO GROUP A STREP (S.PYOGENES) ISOLATED Performed at [facility], [address] [place], [place] [address]    Report Status 08/24/2017 FINAL  Final  Blood culture (routine x 2)     Status: None   Collection Time: 08/22/17  1:40 AM  Result Value Ref Range Status   Specimen Description    Final    BLOOD LEFT ARM Performed at [facility] [place], [address] [place], [place] [address]    Special Requests   Final    BOTTLES DRAWN AEROBIC AND ANAEROBIC Blood Culture adequate volume Performed at [facility] [place], [address] [place], [place] [address]    Culture   Final    NO GROWTH 5 DAYS Performed at [facility], [address] [place], [place] [address]    Report Status 08/27/2017 FINAL  Final  Blood culture (routine x 2)     Status: None   Collection Time: 08/22/17  1:47 AM  Result Value Ref Range Status   Specimen Description   Final    BLOOD RIGHT ANTECUBITAL Performed at [facility] [place], [address] [place], [place] [address]    Special Requests   Final    BOTTLES DRAWN AEROBIC AND ANAEROBIC Blood Culture adequate volume Performed at [facility] [place], [address] [place], [place] [address]    Culture   Final    NO GROWTH 5 DAYS Performed at [facility], [address] [place], [place] [address]    Report Status 08/27/2017 FINAL  Final    Studies/Results: Dg Chest 2 View  Result Date: 08/27/2017 CLINICAL DATA:  Mid chest pain today. EXAM: CHEST - 2 VIEW COMPARISON:  08/21/2017 FINDINGS: The cardiomediastinal silhouette is within normal limits. The lungs are clear. No pleural effusion or pneumothorax is identified. No acute osseous abnormality is seen. IMPRESSION: No active cardiopulmonary disease. Electronically Signed   By: Sebastian AcheAllen  Grady M.D.   On: 08/27/2017 14:33   Ct Angio Chest Pe W/cm &/or Wo Cm  Result Date: 08/27/2017 CLINICAL DATA:  Sickle cell patient with mid sternal chest pain beginning today. EXAM: CT ANGIOGRAPHY CHEST WITH CONTRAST TECHNIQUE: Multidetector CT imaging of the chest was performed using the standard protocol during bolus administration of intravenous contrast. Multiplanar CT image reconstructions and MIPs were obtained to evaluate the vascular anatomy. CONTRAST:  77mL ISOVUE-370  IOPAMIDOL (ISOVUE-370) INJECTION 76% COMPARISON:  Chest radiography 08/21/2017.  CT 09/07/2014. FINDINGS: Cardiovascular: Pulmonary arterial opacification is moderate to good. No pulmonary emboli. No aortic pathology. Mild cardiac enlargement. No pericardial effusion. Mediastinum/Nodes: Normal Lungs/Pleura: Very small pleural effusions layering dependently. The lungs are clear except for a small patchy density at the right apex and in the superior segment of the left lower lobe, most likely to represent scarring. No sign of consolidation or collapse. Upper Abdomen: Normal Musculoskeletal: No acute finding Review of the MIP images confirms the above findings. IMPRESSION: No pulmonary emboli. Tiny pleural effusions layering dependently. Small scars at the right apex and  in the superior segment of the right lower lobe. Electronically Signed   By: Paulina Fusi M.D.   On: 08/27/2017 16:07    Medications: Scheduled Meds: . enoxaparin (LOVENOX) injection  40 mg Subcutaneous Q24H  . HYDROmorphone   Intravenous Q4H  . ketorolac  30 mg Intravenous Q6H  . senna-docusate  1 tablet Oral BID   Continuous Infusions: . sodium chloride 150 mL/hr at 08/28/17 0935  . diphenhydrAMINE     PRN Meds:.bisacodyl, diphenhydrAMINE **OR** diphenhydrAMINE, hydrOXYzine, naloxone **AND** sodium chloride flush, ondansetron **OR** ondansetron (ZOFRAN) IV, polyethylene glycol  Assessment/Plan: Active Problems:   Sickle cell crisis (HCC)   Chest pain  1. Hb Sickle Cell Disease with crisis: Readjust IVF to D5 .45% Saline @ 125 mls/hour, readjust weight based Dilaudid PCA to reflect proper weight base, IV Toradol 30 mg Q 6 H, Monitor vitals very closely, Re-evaluate pain scale regularly, 2 L of Oxygen by [place]. 2. Sickle Cell Anemia: Hb is at baseline. No need for blood transfusion at this time.  3. Chronic pain Syndrome:  Will Restart home pain medication when acute pain subsides. 4. Chest Pain: Atypical, troponin is negative, CTA  showed no PE. Most likely part of sickle cell crisis. Will continue IVF, Toradol and dilaudid PCA. Monitor closely.  Code Status: Full Code Family Communication: N/A Disposition Plan: Not yet ready for discharge  Jaicee Michelotti  If 7PM-7AM, please contact night-coverage.  08/28/2017, 11:21 AM  LOS: 1 day

## 2017-08-29 LAB — COMPREHENSIVE METABOLIC PANEL
ALBUMIN: 3.8 g/dL (ref 3.5–5.0)
ALK PHOS: 64 U/L (ref 38–126)
ALT: 16 U/L (ref 14–54)
AST: 19 U/L (ref 15–41)
Anion gap: 9 (ref 5–15)
BUN: 5 mg/dL — ABNORMAL LOW (ref 6–20)
CHLORIDE: 103 mmol/L (ref 101–111)
CO2: 25 mmol/L (ref 22–32)
Calcium: 8.6 mg/dL — ABNORMAL LOW (ref 8.9–10.3)
Creatinine, Ser: 0.66 mg/dL (ref 0.44–1.00)
GFR calc non Af Amer: >60 mL/min (ref >60)
Glucose, Bld: 97 mg/dL (ref 65–99)
POTASSIUM: 3.1 mmol/L — AB (ref 3.5–5.1)
SODIUM: 137 mmol/L (ref 135–145)
Total Bilirubin: 1.5 mg/dL — ABNORMAL HIGH (ref 0.3–1.2)
Total Protein: 6.7 g/dL (ref 6.5–8.1)

## 2017-08-29 LAB — CBC WITH DIFFERENTIAL/PLATELET
BASOS PCT: 0 %
Basophils Absolute: 0 10*3/uL (ref 0.0–0.1)
EOS ABS: 0 10*3/uL (ref 0.0–0.7)
EOS PCT: 0 %
HCT: 23.6 % — ABNORMAL LOW (ref 36.0–46.0)
HEMOGLOBIN: 8.5 g/dL — AB (ref 12.0–15.0)
Lymphocytes Relative: 17 %
Lymphs Abs: 1.4 10*3/uL (ref 0.7–4.0)
MCH: 26.8 pg (ref 26.0–34.0)
MCHC: 36 g/dL (ref 30.0–36.0)
MCV: 74.4 fL — ABNORMAL LOW (ref 78.0–100.0)
MONOS PCT: 6 %
Monocytes Absolute: 0.5 10*3/uL (ref 0.1–1.0)
NEUTROS PCT: 77 %
Neutro Abs: 6.1 10*3/uL (ref 1.7–7.7)
PLATELETS: 152 10*3/uL (ref 150–400)
RBC: 3.17 MIL/uL — ABNORMAL LOW (ref 3.87–5.11)
RDW: 16.8 % — AB (ref 11.5–15.5)
WBC: 7.9 10*3/uL (ref 4.0–10.5)

## 2017-08-29 MED ORDER — SODIUM CHLORIDE 0.9 % IV BOLUS
500.0000 mL | Freq: Once | INTRAVENOUS | Status: AC
  Administered 2017-08-29: 500 mL via INTRAVENOUS

## 2017-08-29 MED ORDER — POTASSIUM CHLORIDE CRYS ER 20 MEQ PO TBCR
20.0000 meq | EXTENDED_RELEASE_TABLET | Freq: Two times a day (BID) | ORAL | Status: DC
  Administered 2017-08-29 – 2017-09-02 (×9): 20 meq via ORAL
  Filled 2017-08-29 (×9): qty 1

## 2017-08-29 MED ORDER — ACETAMINOPHEN 325 MG PO TABS
650.0000 mg | ORAL_TABLET | ORAL | Status: DC | PRN
  Administered 2017-08-29: 650 mg via ORAL
  Filled 2017-08-29: qty 2

## 2017-08-29 NOTE — Progress Notes (Signed)
Patient ID: Stacy [name], female   DOB: July 18, 1990, 27 y.o.   MRN: [id number] Subjective:  Patient still complaining of central chest pain and tightness. She has no cough, no SOB. She rates her pain at 8/10, also in her legs and lower back. She has no fever.   Objective:  Vital signs in last 24 hours:  Vitals:   08/28/17 2223 08/28/17 2350 08/29/17 0424 08/29/17 0516  BP: (!) 137/93   (!) 136/100  Pulse: 68   75  Resp: 20 (!) 24 16 17   Temp: 98.7 F (37.1 C)     TempSrc: Oral     SpO2: 100% 99% 99% 100%  Weight:      Height:        Intake/Output from previous day:   Intake/Output Summary (Last 24 hours) at 08/29/2017 0837 Last data filed at 08/29/2017 0326 Gross per 24 hour  Intake 3375.83 ml  Output -  Net 3375.83 ml    Physical Exam: General: Alert, awake, oriented x3, in no acute distress.  HEENT: [place]/AT PEERL, EOMI Neck: Trachea midline,  no masses, no thyromegal,y no JVD, no carotid bruit OROPHARYNX:  Moist, No exudate/ erythema/lesions.  Heart: Regular rate and rhythm, without murmurs, rubs, gallops, PMI non-displaced, no heaves or thrills on palpation.  Lungs: Clear to auscultation, no wheezing or rhonchi noted. No increased vocal fremitus resonant to percussion  Abdomen: Soft, nontender, nondistended, positive bowel sounds, no masses no hepatosplenomegaly noted..  Neuro: No focal neurological deficits noted cranial nerves II through XII grossly intact. DTRs 2+ bilaterally upper and lower extremities. Strength 5 out of 5 in bilateral upper and lower extremities. Musculoskeletal: No warm swelling or erythema around joints, no spinal tenderness noted. Psychiatric: Patient alert and oriented x3, good insight and cognition, good recent to remote recall. Lymph node survey: No cervical axillary or inguinal lymphadenopathy noted.  Lab Results:  Basic Metabolic Panel:    Component Value Date/Time   NA 137 08/29/2017 0543   K 3.1 (L) 08/29/2017 0543   CL 103 08/29/2017  0543   CO2 25 08/29/2017 0543   BUN 5 (L) 08/29/2017 0543   CREATININE 0.66 08/29/2017 0543   GLUCOSE 97 08/29/2017 0543   CALCIUM 8.6 (L) 08/29/2017 0543   CBC:    Component Value Date/Time   WBC 7.9 08/29/2017 0543   HGB 8.5 (L) 08/29/2017 0543   HCT 23.6 (L) 08/29/2017 0543   PLT 152 08/29/2017 0543   MCV 74.4 (L) 08/29/2017 0543   NEUTROABS 6.1 08/29/2017 0543   LYMPHSABS 1.4 08/29/2017 0543   MONOABS 0.5 08/29/2017 0543   EOSABS 0.0 08/29/2017 0543   BASOSABS 0.0 08/29/2017 0543    Recent Results (from the past 240 hour(s))  Rapid Strep Screen ([facility] ONLY)     Status: None   Collection Time: 08/22/17 12:02 AM  Result Value Ref Range Status   Streptococcus, Group A Screen (Direct) NEGATIVE NEGATIVE Final    Comment: (NOTE) A Rapid Antigen test may result negative if the antigen level in the sample is below the detection level of this test. The FDA has not cleared this test as a stand-alone test therefore the rapid antigen negative result has reflexed to a Group A Strep culture. Performed at [facility], [address] [place], [place] [address]   Culture, group A strep     Status: None   Collection Time: 08/22/17 12:02 AM  Result Value Ref Range Status   Specimen Description  Final    THROAT Performed at [facility] [place], [address] [place], [place] [address]    Special Requests   Final    NONE Reflexed from [id number] Performed at [facility] [place], [address] [place], [place] [address]    Culture   Final    NO GROUP A STREP (S.PYOGENES) ISOLATED Performed at [facility], [address] [place], [place] [address]    Report Status 08/24/2017 FINAL  Final  Blood culture (routine x 2)     Status: None   Collection Time: 08/22/17  1:40 AM  Result Value Ref Range Status   Specimen Description   Final    BLOOD LEFT ARM Performed at [facility] [place], [address] [place], [place] [address]    Special  Requests   Final    BOTTLES DRAWN AEROBIC AND ANAEROBIC Blood Culture adequate volume Performed at [facility] [place], [address] [place], [place] [address]    Culture   Final    NO GROWTH 5 DAYS Performed at [facility], [address] [place], [place] [address]    Report Status 08/27/2017 FINAL  Final  Blood culture (routine x 2)     Status: None   Collection Time: 08/22/17  1:47 AM  Result Value Ref Range Status   Specimen Description   Final    BLOOD RIGHT ANTECUBITAL Performed at [facility] [place], [address] [place], [place] [address]    Special Requests   Final    BOTTLES DRAWN AEROBIC AND ANAEROBIC Blood Culture adequate volume Performed at [facility] [place], [address] [place], [place] [address]    Culture   Final    NO GROWTH 5 DAYS Performed at [facility], [address] [place], [place] [address]    Report Status 08/27/2017 FINAL  Final    Studies/Results: Dg Chest 2 View  Result Date: 08/27/2017 CLINICAL DATA:  Mid chest pain today. EXAM: CHEST - 2 VIEW COMPARISON:  08/21/2017 FINDINGS: The cardiomediastinal silhouette is within normal limits. The lungs are clear. No pleural effusion or pneumothorax is identified. No acute osseous abnormality is seen. IMPRESSION: No active cardiopulmonary disease. Electronically Signed   By: Sebastian AcheAllen  Grady M.D.   On: 08/27/2017 14:33   Ct Angio Chest Pe W/cm &/or Wo Cm  Result Date: 08/27/2017 CLINICAL DATA:  Sickle cell patient with mid sternal chest pain beginning today. EXAM: CT ANGIOGRAPHY CHEST WITH CONTRAST TECHNIQUE: Multidetector CT imaging of the chest was performed using the standard protocol during bolus administration of intravenous contrast. Multiplanar CT image reconstructions and MIPs were obtained to evaluate the vascular anatomy. CONTRAST:  77mL ISOVUE-370 IOPAMIDOL (ISOVUE-370) INJECTION 76% COMPARISON:  Chest radiography 08/21/2017.  CT 09/07/2014. FINDINGS: Cardiovascular:  Pulmonary arterial opacification is moderate to good. No pulmonary emboli. No aortic pathology. Mild cardiac enlargement. No pericardial effusion. Mediastinum/Nodes: Normal Lungs/Pleura: Very small pleural effusions layering dependently. The lungs are clear except for a small patchy density at the right apex and in the superior segment of the left lower lobe, most likely to represent scarring. No sign of consolidation or collapse. Upper Abdomen: Normal Musculoskeletal: No acute finding Review of the MIP images confirms the above findings. IMPRESSION: No pulmonary emboli. Tiny pleural effusions layering dependently. Small scars at the right apex and in the superior segment of the right lower lobe. Electronically Signed   By: Paulina FusiMark  Shogry  M.D.   On: 08/27/2017 16:07    Medications: Scheduled Meds: . enoxaparin (LOVENOX) injection  40 mg Subcutaneous Q24H  . HYDROmorphone   Intravenous Q4H  . ketorolac  30 mg Intravenous Q6H  . potassium chloride  20 mEq Oral BID  . senna-docusate  1 tablet Oral BID   Continuous Infusions: . sodium chloride 150 mL/hr at 08/29/17 0434  . diphenhydrAMINE     PRN Meds:.bisacodyl, diphenhydrAMINE **OR** diphenhydrAMINE, hydrOXYzine, naloxone **AND** sodium chloride flush, ondansetron **OR** ondansetron (ZOFRAN) IV, oxyCODONE, polyethylene glycol  Assessment/Plan: Active Problems:   Sickle cell crisis (HCC)   Central chest pain   Reticulocytosis  1. Hb Sickle Cell Disease with crisis: Continue IVF, weight based Dilaudid PCA and IV Toradol 30 mg Q 6 H, Monitor vitals very closely, Re-evaluate pain scale regularly, 2 L of Oxygen by [place]. 2. Sickle Cell Anemia: Hb close to baseline. No indication for blood transfusion at this time.  3. Chronic pain Syndrome:  Will continue her home pain medication when acute pain subsides. 4. Chest Pain: Atypical, troponin is negative, CTA showed no PE. Most likely part of sickle cell crisis. Will continue IVF, Toradol and dilaudid PCA.  Monitor closely. May need a repeat CXR  Code Status: Full Code Family Communication: N/A Disposition Plan: Not yet ready for discharge  Lamara Brecht  If 7PM-7AM, please contact night-coverage.  08/29/2017, 8:37 AM  LOS: 2 days

## 2017-08-30 ENCOUNTER — Inpatient Hospital Stay (HOSPITAL_COMMUNITY): Payer: BLUE CROSS/BLUE SHIELD

## 2017-08-30 DIAGNOSIS — J189 Pneumonia, unspecified organism: Secondary | ICD-10-CM

## 2017-08-30 DIAGNOSIS — R0789 Other chest pain: Secondary | ICD-10-CM

## 2017-08-30 LAB — BASIC METABOLIC PANEL
Anion gap: 6 (ref 5–15)
BUN: 5 mg/dL — AB (ref 6–20)
CALCIUM: 8.8 mg/dL — AB (ref 8.9–10.3)
CHLORIDE: 105 mmol/L (ref 101–111)
CO2: 27 mmol/L (ref 22–32)
CREATININE: 0.67 mg/dL (ref 0.44–1.00)
GFR calc non Af Amer: >60 mL/min (ref >60)
GLUCOSE: 107 mg/dL — AB (ref 65–99)
Potassium: 3.6 mmol/L (ref 3.5–5.1)
Sodium: 138 mmol/L (ref 135–145)

## 2017-08-30 LAB — CBC WITH DIFFERENTIAL/PLATELET
BASOS PCT: 0 %
Basophils Absolute: 0 10*3/uL (ref 0.0–0.1)
Eosinophils Absolute: 0 10*3/uL (ref 0.0–0.7)
Eosinophils Relative: 0 %
HEMATOCRIT: 22.1 % — AB (ref 36.0–46.0)
HEMOGLOBIN: 8 g/dL — AB (ref 12.0–15.0)
LYMPHS ABS: 1.2 10*3/uL (ref 0.7–4.0)
Lymphocytes Relative: 8 %
MCH: 26.2 pg (ref 26.0–34.0)
MCHC: 36.2 g/dL — AB (ref 30.0–36.0)
MCV: 72.5 fL — AB (ref 78.0–100.0)
MONOS PCT: 6 %
Monocytes Absolute: 0.9 10*3/uL (ref 0.1–1.0)
NEUTROS ABS: 13.2 10*3/uL — AB (ref 1.7–7.7)
Neutrophils Relative %: 86 %
Platelets: 163 10*3/uL (ref 150–400)
RBC: 3.05 MIL/uL — ABNORMAL LOW (ref 3.87–5.11)
RDW: 16.8 % — AB (ref 11.5–15.5)
WBC: 15.3 10*3/uL — ABNORMAL HIGH (ref 4.0–10.5)

## 2017-08-30 MED ORDER — VANCOMYCIN HCL 10 G IV SOLR
1500.0000 mg | Freq: Once | INTRAVENOUS | Status: AC
  Administered 2017-08-30: 1500 mg via INTRAVENOUS
  Filled 2017-08-30: qty 1500

## 2017-08-30 MED ORDER — PIPERACILLIN-TAZOBACTAM 3.375 G IVPB 30 MIN
3.3750 g | Freq: Once | INTRAVENOUS | Status: AC
  Administered 2017-08-30: 3.375 g via INTRAVENOUS
  Filled 2017-08-30: qty 50

## 2017-08-30 MED ORDER — VANCOMYCIN HCL IN DEXTROSE 750-5 MG/150ML-% IV SOLN
750.0000 mg | Freq: Two times a day (BID) | INTRAVENOUS | Status: DC
  Administered 2017-08-31 – 2017-09-02 (×5): 750 mg via INTRAVENOUS
  Filled 2017-08-30 (×7): qty 150

## 2017-08-30 MED ORDER — PIPERACILLIN-TAZOBACTAM 3.375 G IVPB
3.3750 g | Freq: Three times a day (TID) | INTRAVENOUS | Status: DC
  Administered 2017-08-30 – 2017-09-02 (×8): 3.375 g via INTRAVENOUS
  Filled 2017-08-30 (×8): qty 50

## 2017-08-30 NOTE — Progress Notes (Signed)
Pharmacy Antibiotic Note  Stacy [name] is a 27 y.o. female admitted on 08/27/2017 with sickle cell pain crisis.  Pharmacy has been consulted for vancomycin + piperacillin/tazobactam dosing for pnemonia.  08/30/17 today  WBC 15.3  Tmax 100.8 F  Plan:  Piperacillin/tazobactam 3.375 g IV q8h EI  Vancomycin 1500 mg IV loading dose followed by 750 mg IV q12h  Goal AUC 400-500  Follow SCr and vancomycin levels once at steady state  Recommending checking MRSA PCR with potential to discontinue vancomycin if negative  Height: 5' (152.4 cm) Weight: 177 lb 3.2 oz (80.4 kg) IBW/kg (Calculated) : 45.5  Temp (24hrs), Avg:99.3 F (37.4 C), Min:98.2 F (36.8 C), Max:100.8 F (38.2 C)  Recent Labs  Lab 08/27/17 1442 08/28/17 0332 08/29/17 0543 08/30/17 1207  WBC 8.9 7.1 7.9 15.3*  CREATININE 0.72  --  0.66 0.67    Estimated Creatinine Clearance: 99.2 mL/min (by C-G formula based on SCr of 0.67 mg/dL).    Allergies  Allergen Reactions  . Demerol [Meperidine] Other (See Comments)    Seizures   Antimicrobials this admission: vancomycin 6/4 >>  Piperacillin/tazobactam 6/4 >>   Dose adjustments this admission:  Microbiology results: None  Thank you for allowing pharmacy to be a part of this patient's care.  Stacy [name], PharmD, BCPS Clinical Pharmacist Pager: [phone] 08/30/17 3:35 PM

## 2017-08-30 NOTE — Progress Notes (Signed)
Patient ID: Stacy [name], female   DOB: 09/26/1990, 27 y.o.   MRN: [id number] Subjective:  Chest pain is worse today, no fever, no SOB, no cough. She also has ongoing pain in her legs and lower back which is slightly better.   Objective:  Vital signs in last 24 hours:  Vitals:   08/30/17 0941 08/30/17 1135 08/30/17 1323 08/30/17 1602  BP: (!) 124/91  (!) 128/95   Pulse: 89  89   Resp: 19 18 17  (!) 21  Temp: 98.2 F (36.8 C)  98.8 F (37.1 C)   TempSrc:   Oral   SpO2: 98% 100% 100% 100%  Weight:      Height:       Intake/Output from previous day:   Intake/Output Summary (Last 24 hours) at 08/30/2017 1654 Last data filed at 08/30/2017 0600 Gross per 24 hour  Intake 2760 ml  Output -  Net 2760 ml    Physical Exam: General: Alert, awake, oriented x3, in no acute distress.  HEENT: [place]/AT [place]ER[place], EOMI Neck: Trachea midline,  no masses, no thyromegal,y no JVD, no carotid bruit OROPHARYNX:  Moist, No exudate/ erythema/lesions.  Heart: Regular rate and rhythm, without murmurs, rubs, gallops, PMI non-displaced, no heaves or thrills on palpation.  Lungs: Clear to auscultation, no wheezing or rhonchi noted. No increased vocal fremitus resonant to percussion  Abdomen: Soft, nontender, nondistended, positive bowel sounds, no masses no hepatosplenomegaly noted..  Neuro: No focal neurological deficits noted cranial nerves II through XII grossly intact. DTRs 2+ bilaterally upper and lower extremities. Strength 5 out of 5 in bilateral upper and lower extremities. Musculoskeletal: No warm swelling or erythema around joints, no spinal tenderness noted. Psychiatric: Patient alert and oriented x3, good insight and cognition, good recent to remote recall. Lymph node survey: No cervical axillary or inguinal lymphadenopathy noted.  Lab Results:  Basic Metabolic Panel:    Component Value Date/Time   NA 138 08/30/2017 1207   K 3.6 08/30/2017 1207   CL 105 08/30/2017 1207   CO2 27 08/30/2017  1207   BUN 5 (L) 08/30/2017 1207   CREATININE 0.67 08/30/2017 1207   GLUCOSE 107 (H) 08/30/2017 1207   CALCIUM 8.8 (L) 08/30/2017 1207   CBC:    Component Value Date/Time   WBC 15.3 (H) 08/30/2017 1207   HGB 8.0 (L) 08/30/2017 1207   HCT 22.1 (L) 08/30/2017 1207   PLT 163 08/30/2017 1207   MCV 72.5 (L) 08/30/2017 1207   NEUTROABS 13.2 (H) 08/30/2017 1207   LYMPHSABS 1.2 08/30/2017 1207   MONOABS 0.9 08/30/2017 1207   EOSABS 0.0 08/30/2017 1207   BASOSABS 0.0 08/30/2017 1207    Recent Results (from the past 240 hour(s))  Rapid Strep Screen ([facility] ONLY)     Status: None   Collection Time: 08/22/17 12:02 AM  Result Value Ref Range Status   Streptococcus, Group A Screen (Direct) NEGATIVE NEGATIVE Final    Comment: (NOTE) A Rapid Antigen test may result negative if the antigen level in the sample is below the detection level of this test. The FDA has not cleared this test as a stand-alone test therefore the rapid antigen negative result has reflexed to a Group A Strep culture. Performed at [facility], [address] [place], [place] [address]   Culture, group A strep     Status: None   Collection Time: 08/22/17 12:02 AM  Result Value Ref Range Status   Specimen Description   Final  THROAT Performed at [facility], [address] [place], [place] [address]    Special Requests   Final    NONE Reflexed from [id number] Performed at [facility], [address] [place], [place] [address]    Culture   Final    NO GROUP A STREP (S.PYOGENES) ISOLATED Performed at [facility], [address] [place], [place] [address]    Report Status 08/24/2017 FINAL  Final  Blood culture (routine x 2)     Status: None   Collection Time: 08/22/17  1:40 AM  Result Value Ref Range Status   Specimen Description   Final    BLOOD LEFT ARM Performed at [facility] [place] [facility], [address] [place], [place] [address]    Special Requests   Final     BOTTLES DRAWN AEROBIC AND ANAEROBIC Blood Culture adequate volume Performed at [facility], [address] [place], [place] [address]    Culture   Final    NO GROWTH 5 DAYS Performed at [facility], [address] [place], [place] [address]    Report Status 08/27/2017 FINAL  Final  Blood culture (routine x 2)     Status: None   Collection Time: 08/22/17  1:47 AM  Result Value Ref Range Status   Specimen Description   Final    BLOOD RIGHT ANTECUBITAL Performed at [facility], [address] [place] [address] [place], [place] [address]    Special Requests   Final    BOTTLES DRAWN AEROBIC AND ANAEROBIC Blood Culture adequate volume Performed at [facility] [place], [address] [place], [place] [address]    Culture   Final    NO GROWTH 5 DAYS Performed at [facility], [address] [place], [place] [address]    Report Status 08/27/2017 FINAL  Final    Studies/Results: Dg Chest Port 1 View  Addendum Date: 08/30/2017   ADDENDUM REPORT: 08/30/2017 14:40 ADDENDUM: These results will be called to the ordering clinician or representative by the Radiologist Assistant, and communication documented in the PACS or zVision Dashboard. Electronically Signed   By: Bary RichardStan  Maynard M.D.   On: 08/30/2017 14:40   Result Date: 08/30/2017 CLINICAL DATA:  Sickle cell pt experiencing increased chest tightness and abd discomfort since admission 3 days ago - nonsmoker EXAM: PORTABLE CHEST 1 VIEW COMPARISON:  Chest x-ray dated 08/27/2017. FINDINGS: Ill-defined opacity within the RIGHT upper lobe, consistent with pneumonia. Lungs are otherwise clear. No pleural effusion or pneumothorax seen. Borderline cardiomegaly is stable. IMPRESSION: RIGHT upper lobe pneumonia. Electronically Signed: By: Bary RichardStan  Maynard M.D. On: 08/30/2017 14:36    Medications: Scheduled Meds: . enoxaparin (LOVENOX) injection  40 mg Subcutaneous Q24H  . HYDROmorphone   Intravenous Q4H  . ketorolac  30 mg  Intravenous Q6H  . potassium chloride  20 mEq Oral BID  . senna-docusate  1 tablet Oral BID   Continuous Infusions: . sodium chloride 50 mL/hr at 08/30/17 1254  . diphenhydrAMINE    . piperacillin-tazobactam (ZOSYN)  IV    . vancomycin 1,500 mg (08/30/17 1643)  . [START ON 08/31/2017] vancomycin     PRN Meds:.acetaminophen, bisacodyl, diphenhydrAMINE **OR** diphenhydrAMINE, hydrOXYzine, naloxone **AND** sodium chloride flush, ondansetron **OR** ondansetron (ZOFRAN) IV, oxyCODONE, polyethylene glycol  Consultants:  None  Procedures:  None  Antibiotics:  Start Abx Vanco and Zosyn Day 1  Assessment/Plan: Active Problems:   Sickle cell crisis (  HCC)   Central chest pain   Reticulocytosis  1. Hb Sickle Cell Disease with crisis: Continue IVF, weight based Dilaudid PCA and IV Toradol 30 mg Q 6 H, Monitor vitals very closely, Re-evaluate pain scale regularly, 2 L of Oxygen by [place]. 2. Right Upper Lobe Pneumonia: CXR confirmed RUL Pneumonia. Patient has been started on IV Vanco and Zosyn for possible HCAP. Will continue IVF and other supportive therapies. 3. Sickle Cell Anemia: Hb close to baseline. No indication for blood transfusion at this time.  4. Chronic pain Syndrome:  Will continue her home pain medication when acute pain subsides.  Code Status: Full Code Family Communication: N/A Disposition Plan: Not yet ready for discharge  Ladonna Vanorder  If 7PM-7AM, please contact night-coverage.  08/30/2017, 4:54 PM  LOS: 3 days

## 2017-08-31 LAB — PROCALCITONIN

## 2017-08-31 LAB — MRSA PCR SCREENING: MRSA BY PCR: POSITIVE — AB

## 2017-08-31 MED ORDER — MUPIROCIN 2 % EX OINT
1.0000 "application " | TOPICAL_OINTMENT | Freq: Two times a day (BID) | CUTANEOUS | Status: DC
  Administered 2017-09-01 – 2017-09-02 (×4): 1 via NASAL
  Filled 2017-08-31 (×2): qty 22

## 2017-08-31 MED ORDER — CHLORHEXIDINE GLUCONATE CLOTH 2 % EX PADS
6.0000 | MEDICATED_PAD | Freq: Every day | CUTANEOUS | Status: DC
  Administered 2017-09-01 – 2017-09-02 (×2): 6 via TOPICAL

## 2017-08-31 NOTE — Progress Notes (Signed)
Patient ID: Stacy [name], female   DOB: 1990-09-25, 27 y.o.   MRN: [id number] Subjective:  Patient is slowly getting better but still having significant pain in her legs and lower back. Now at pain level 6-7/10. She denies any fever, no SOB.  Objective:  Vital signs in last 24 hours:  Vitals:   08/31/17 0957 08/31/17 1144 08/31/17 1415 08/31/17 1611  BP: (!) 122/94  (!) 118/96   Pulse: 81  69   Resp: 15 13 18 15   Temp: (!) 97.4 F (36.3 C)  97.9 F (36.6 C)   TempSrc: Oral  Oral   SpO2: 100% 100% 100% 100%  Weight:      Height:        Intake/Output from previous day:   Intake/Output Summary (Last 24 hours) at 08/31/2017 1850 Last data filed at 08/31/2017 1608 Gross per 24 hour  Intake 1479.17 ml  Output 0 ml  Net 1479.17 ml    Physical Exam: General: Alert, awake, oriented x3, in no acute distress.  HEENT: [place]/AT [place], EOMI Neck: Trachea midline,  no masses, no thyromegal,y no JVD, no carotid bruit OROPHARYNX:  Moist, No exudate/ erythema/lesions.  Heart: Regular rate and rhythm, without murmurs, rubs, gallops, PMI non-displaced, no heaves or thrills on palpation.  Lungs: Clear to auscultation, no wheezing or rhonchi noted. No increased vocal fremitus resonant to percussion  Abdomen: Soft, nontender, nondistended, positive bowel sounds, no masses no hepatosplenomegaly noted..  Neuro: No focal neurological deficits noted cranial nerves II through XII grossly intact. DTRs 2+ bilaterally upper and lower extremities. Strength 5 out of 5 in bilateral upper and lower extremities. Musculoskeletal: No warm swelling or erythema around joints, no spinal tenderness noted. Psychiatric: Patient alert and oriented x3, good insight and cognition, good recent to remote recall. Lymph node survey: No cervical axillary or inguinal lymphadenopathy noted.  Lab Results:  Basic Metabolic Panel:    Component Value Date/Time   NA 138 08/30/2017 1207   K 3.6 08/30/2017 1207   CL 105  08/30/2017 1207   CO2 27 08/30/2017 1207   BUN 5 (L) 08/30/2017 1207   CREATININE 0.67 08/30/2017 1207   GLUCOSE 107 (H) 08/30/2017 1207   CALCIUM 8.8 (L) 08/30/2017 1207   CBC:    Component Value Date/Time   WBC 15.3 (H) 08/30/2017 1207   HGB 8.0 (L) 08/30/2017 1207   HCT 22.1 (L) 08/30/2017 1207   PLT 163 08/30/2017 1207   MCV 72.5 (L) 08/30/2017 1207   NEUTROABS 13.2 (H) 08/30/2017 1207   LYMPHSABS 1.2 08/30/2017 1207   MONOABS 0.9 08/30/2017 1207   EOSABS 0.0 08/30/2017 1207   BASOSABS 0.0 08/30/2017 1207    Recent Results (from the past 240 hour(s))  Rapid Strep Screen ([facility] ONLY)     Status: None   Collection Time: 08/22/17 12:02 AM  Result Value Ref Range Status   Streptococcus, Group A Screen (Direct) NEGATIVE NEGATIVE Final    Comment: (NOTE) A Rapid Antigen test may result negative if the antigen level in the sample is below the detection level of this test. The FDA has not cleared this test as a stand-alone test therefore the rapid antigen negative result has reflexed to a Group A Strep culture. Performed at [facility], [address] [place], [place] [address]   Culture, group A strep     Status: None   Collection Time: 08/22/17 12:02 AM  Result Value Ref Range Status   Specimen Description  Final    THROAT Performed at [facility], [address] [place], [place] [address]    Special Requests   Final    NONE Reflexed from [id number] Performed at [facility], [address] [place], [place] [address]    Culture   Final    NO GROUP A STREP (S.PYOGENES) ISOLATED Performed at [facility], [address] [place] [address], [place], [place] [address]    Report Status 08/24/2017 FINAL  Final  Blood culture (routine x 2)     Status: None   Collection Time: 08/22/17  1:40 AM  Result Value Ref Range Status   Specimen Description   Final    BLOOD LEFT ARM Performed at [facility], [address] [place], [place]  [address]    Special Requests   Final    BOTTLES DRAWN AEROBIC AND ANAEROBIC Blood Culture adequate volume Performed at [facility], [address], [place], [place] [address]    Culture   Final    NO GROWTH 5 DAYS Performed at [facility], [address], [place], [place] [address]    Report Status 08/27/2017 FINAL  Final  Blood culture (routine x 2)     Status: None   Collection Time: 08/22/17  1:47 AM  Result Value Ref Range Status   Specimen Description   Final    BLOOD RIGHT ANTECUBITAL Performed at [facility], [address] [place], [place] [address]    Special Requests   Final    BOTTLES DRAWN AEROBIC AND ANAEROBIC Blood Culture adequate volume Performed at [facility], [address] [place], [place] [address]    Culture   Final    NO GROWTH 5 DAYS Performed at [facility], [address] [place], [place] [address]    Report Status 08/27/2017 FINAL  Final    Studies/Results: Dg Chest Port 1 View  Addendum Date: 08/30/2017   ADDENDUM REPORT: 08/30/2017 14:40 ADDENDUM: These results will be called to the ordering clinician or representative by the Radiologist Assistant, and communication documented in the PACS or zVision Dashboard. Electronically Signed   By: Bary Richard M.D.   On: 08/30/2017 14:40   Result Date: 08/30/2017 CLINICAL DATA:  Sickle cell pt experiencing increased chest tightness and abd discomfort since admission 3 days ago - nonsmoker EXAM: PORTABLE CHEST 1 VIEW COMPARISON:  Chest x-ray dated 08/27/2017. FINDINGS: Ill-defined opacity within the RIGHT upper lobe, consistent with pneumonia. Lungs are otherwise clear. No pleural effusion or pneumothorax seen. Borderline cardiomegaly is stable. IMPRESSION: RIGHT upper lobe pneumonia. Electronically Signed: By: Bary Richard M.D. On: 08/30/2017 14:36    Medications: Scheduled Meds: . enoxaparin (LOVENOX) injection  40 mg Subcutaneous Q24H  . HYDROmorphone    Intravenous Q4H  . ketorolac  30 mg Intravenous Q6H  . potassium chloride  20 mEq Oral BID  . senna-docusate  1 tablet Oral BID   Continuous Infusions: . sodium chloride 50 mL/hr at 08/31/17 1148  . diphenhydrAMINE    . piperacillin-tazobactam (ZOSYN)  IV Stopped (08/31/17 1746)  . vancomycin Stopped (08/31/17 1708)   PRN Meds:.acetaminophen, bisacodyl, diphenhydrAMINE **OR** diphenhydrAMINE, hydrOXYzine, naloxone **AND** sodium chloride flush, ondansetron **OR** ondansetron (ZOFRAN) IV, oxyCODONE, polyethylene glycol  Consultants:  None  Procedures:  None  Antibiotics:  Day 2 on Vanco and Zosyn  Assessment/Plan: Active Problems:   Sickle cell crisis (HCC)   Central chest  pain   Reticulocytosis   HCAP (healthcare-associated pneumonia)   Chest tightness   1. Hb Sickle Cell Disease with crisis:[facility]F to [facility], continue weight based Dilaudid PCA and IV Toradol 30 mg Q 6 H, Monitor vitals very closely, Re-evaluate pain scale regularly, 2 L of Oxygen by [place]. 2. Right Upper Lobe Pneumonia: Continue IV Vanco and Zosyn. IVF and other supportive therapies. 3. Sickle Cell Anemia:Hb close to baseline. No indication for blood transfusion at this time. 4. Chronic pain Syndrome:Will continue her home pain medication   Code Status: Full Code Family Communication: N/A Disposition Plan: Not yet ready for discharge  Sanaya Gwilliam  If 7PM-7AM, please contact night-coverage.  08/31/2017, 6:50 PM  LOS: 4 days

## 2017-08-31 NOTE — Progress Notes (Signed)
CRITICAL VALUE ALERT  Critical Value:  MRSA (+)  Date & Time Notied:  08/31/17 2045  Provider Notified: Schorr, NP  Orders Received/Actions taken: standing orders initiated.

## 2017-09-01 DIAGNOSIS — J181 Lobar pneumonia, unspecified organism: Secondary | ICD-10-CM

## 2017-09-01 DIAGNOSIS — J189 Pneumonia, unspecified organism: Secondary | ICD-10-CM

## 2017-09-01 LAB — COMPREHENSIVE METABOLIC PANEL
ALT: 16 U/L (ref 14–54)
ANION GAP: 7 (ref 5–15)
AST: 21 U/L (ref 15–41)
Albumin: 3.5 g/dL (ref 3.5–5.0)
Alkaline Phosphatase: 56 U/L (ref 38–126)
BUN: 5 mg/dL — ABNORMAL LOW (ref 6–20)
CHLORIDE: 103 mmol/L (ref 101–111)
CO2: 29 mmol/L (ref 22–32)
CREATININE: 0.84 mg/dL (ref 0.44–1.00)
Calcium: 8.9 mg/dL (ref 8.9–10.3)
Glucose, Bld: 117 mg/dL — ABNORMAL HIGH (ref 65–99)
Potassium: 3.6 mmol/L (ref 3.5–5.1)
SODIUM: 139 mmol/L (ref 135–145)
Total Bilirubin: 2.1 mg/dL — ABNORMAL HIGH (ref 0.3–1.2)
Total Protein: 7.2 g/dL (ref 6.5–8.1)

## 2017-09-01 LAB — CBC WITH DIFFERENTIAL/PLATELET
Basophils Absolute: 0 10*3/uL (ref 0.0–0.1)
Basophils Relative: 0 %
EOS ABS: 0.1 10*3/uL (ref 0.0–0.7)
EOS PCT: 1 %
HCT: 21.9 % — ABNORMAL LOW (ref 36.0–46.0)
Hemoglobin: 7.5 g/dL — ABNORMAL LOW (ref 12.0–15.0)
LYMPHS ABS: 2 10*3/uL (ref 0.7–4.0)
LYMPHS PCT: 22 %
MCH: 25.5 pg — AB (ref 26.0–34.0)
MCHC: 34.2 g/dL (ref 30.0–36.0)
MCV: 74.5 fL — AB (ref 78.0–100.0)
MONO ABS: 0.6 10*3/uL (ref 0.1–1.0)
Monocytes Relative: 7 %
Neutro Abs: 6.4 10*3/uL (ref 1.7–7.7)
Neutrophils Relative %: 70 %
PLATELETS: 194 10*3/uL (ref 150–400)
RBC: 2.94 MIL/uL — AB (ref 3.87–5.11)
RDW: 17 % — ABNORMAL HIGH (ref 11.5–15.5)
WBC: 9.1 10*3/uL (ref 4.0–10.5)

## 2017-09-01 NOTE — Progress Notes (Signed)
Pt reports numbness and tingling in bilateral lower extremities and right upper extremity. MD made aware. Murel Shenberger, Lavone OrnSARA K, RN

## 2017-09-01 NOTE — Progress Notes (Signed)
Patient ID: Stacy [name], female   DOB: 1990/05/12, 27 y.o.   MRN: [id number] Subjective:  Patient feels a little better than yesterday. Pain is now at 5/10, goal is 3/10. She has no fever, no SOB. Chest pain is better.   Objective:  Vital signs in last 24 hours:  Vitals:   09/01/17 0824 09/01/17 0943 09/01/17 1243 09/01/17 1404  BP:  127/86  (!) 131/99  Pulse:  79  79  Resp: 10 18 14 18   Temp:  (!) 97.5 F (36.4 C)  98.5 F (36.9 C)  TempSrc:  Oral  Oral  SpO2: 100% 100% 99% 97%  Weight:      Height:        Intake/Output from previous day:   Intake/Output Summary (Last 24 hours) at 09/01/2017 1442 Last data filed at 09/01/2017 0200 Gross per 24 hour  Intake 1200 ml  Output -  Net 1200 ml    Physical Exam: General: Alert, awake, oriented x3, in no acute distress.  HEENT: [place]/AT [place], EOMI Neck: Trachea midline,  no masses, no thyromegal,y no JVD, no carotid bruit OROPHARYNX:  Moist, No exudate/ erythema/lesions.  Heart: Regular rate and rhythm, without murmurs, rubs, gallops, PMI non-displaced, no heaves or thrills on palpation.  Lungs: Clear to auscultation, no wheezing or rhonchi noted. No increased vocal fremitus resonant to percussion  Abdomen: Soft, nontender, nondistended, positive bowel sounds, no masses no hepatosplenomegaly noted..  Neuro: No focal neurological deficits noted cranial nerves II through XII grossly intact. DTRs 2+ bilaterally upper and lower extremities. Strength 5 out of 5 in bilateral upper and lower extremities. Musculoskeletal: No warm swelling or erythema around joints, no spinal tenderness noted. Psychiatric: Patient alert and oriented x3, good insight and cognition, good recent to remote recall. Lymph node survey: No cervical axillary or inguinal lymphadenopathy noted.  Lab Results:  Basic Metabolic Panel:    Component Value Date/Time   NA 139 09/01/2017 0458   K 3.6 09/01/2017 0458   CL 103 09/01/2017 0458   CO2 29 09/01/2017 0458    BUN 5 (L) 09/01/2017 0458   CREATININE 0.84 09/01/2017 0458   GLUCOSE 117 (H) 09/01/2017 0458   CALCIUM 8.9 09/01/2017 0458   CBC:    Component Value Date/Time   WBC 9.1 09/01/2017 0458   HGB 7.5 (L) 09/01/2017 0458   HCT 21.9 (L) 09/01/2017 0458   PLT 194 09/01/2017 0458   MCV 74.5 (L) 09/01/2017 0458   NEUTROABS 6.4 09/01/2017 0458   LYMPHSABS 2.0 09/01/2017 0458   MONOABS 0.6 09/01/2017 0458   EOSABS 0.1 09/01/2017 0458   BASOSABS 0.0 09/01/2017 0458    Recent Results (from the past 240 hour(s))  MRSA PCR Screening     Status: Abnormal   Collection Time: 08/31/17  7:18 PM  Result Value Ref Range Status   MRSA by PCR POSITIVE (A) NEGATIVE Final    Comment:        The GeneXpert MRSA Assay (FDA approved for NASAL specimens only), is one component of a comprehensive MRSA colonization surveillance program. It is not intended to diagnose MRSA infection nor to guide or monitor treatment for MRSA infections. RESULT CALLED TO, READ BACK BY AND VERIFIED WITH: Langley Adie CERIC,RN [id number] @ 2045 BY J SCOTTON Performed at [facility], [address] [place], [place] [address]     Studies/Results: No results found.  Medications: Scheduled Meds: . Chlorhexidine Gluconate Cloth  6 each Topical Q0600  . enoxaparin (LOVENOX) injection  40 mg  Subcutaneous Q24H  . HYDROmorphone   Intravenous Q4H  . ketorolac  30 mg Intravenous Q6H  . mupirocin ointment  1 application Nasal BID  . potassium chloride  20 mEq Oral BID  . senna-docusate  1 tablet Oral BID   Continuous Infusions: . sodium chloride 50 mL/hr at 09/01/17 0700  . diphenhydrAMINE    . piperacillin-tazobactam (ZOSYN)  IV Stopped (09/01/17 0911)  . vancomycin Stopped (09/01/17 0611)   PRN Meds:.acetaminophen, bisacodyl, diphenhydrAMINE **OR** diphenhydrAMINE, hydrOXYzine, naloxone **AND** sodium chloride flush, ondansetron **OR** ondansetron (ZOFRAN) IV, oxyCODONE, polyethylene  glycol  Consultants:  None  Procedures:  None  Antibiotics:  Day 3 on Vanco and Zosyn  Assessment/Plan: Active Problems:   Sickle cell crisis (HCC)   Central chest pain   Reticulocytosis   HCAP (healthcare-associated pneumonia)   Chest tightness  1. Hb Sickle Cell Disease with crisis:KVO, continue weight based Dilaudid PCA and IV Toradol 30 mg Q 6 H, Monitor vitals very closely, Re-evaluate pain scale regularly, 2 L of Oxygen by [place]. 2. Right Upper Lobe Pneumonia: Continue IV Vanco and Zosyn especially with + MRSA. Repeat CXR in am 3. Sickle Cell Anemia:Hb close to baseline. No indication for blood transfusion at this time. 4. Chronic pain Syndrome:Will continue her home pain medication when acute pain subsides.  Code Status: Full Code Family Communication: N/A Disposition Plan: Not yet ready for discharge  Brinley Treanor  If 7PM-7AM, please contact night-coverage.  09/01/2017, 2:42 PM  LOS: 5 days

## 2017-09-02 ENCOUNTER — Inpatient Hospital Stay (HOSPITAL_COMMUNITY): Payer: BLUE CROSS/BLUE SHIELD

## 2017-09-02 MED ORDER — AMOXICILLIN-POT CLAVULANATE 875-125 MG PO TABS: 1.0000 | ORAL_TABLET | Freq: Two times a day (BID) | ORAL | 0 refills | Status: AC

## 2017-09-02 MED ORDER — OXYCODONE HCL 15 MG PO TABS: 15.0000 mg | ORAL_TABLET | ORAL | 0 refills | Status: AC | PRN

## 2017-09-02 NOTE — Progress Notes (Signed)
Dilaudid PCA 7 mg wasted in sink [facility] [employer] with  Primary RN Wilson SingerAmber Meng [place], RN and Murrell ReddenErika Pelletier, RN

## 2017-09-02 NOTE — Discharge Summary (Signed)
Physician Discharge Summary  Stacy [name]:[id number] DOB: [birth year] DOA: 08/27/2017  PCP: System, Pcp Not In  Admit date: 08/27/2017  Discharge date: 09/02/2017  Discharge Diagnoses:  Active Problems:   Sickle cell crisis (HCC)   Central chest pain   Reticulocytosis   HCAP (healthcare-associated pneumonia)   Chest tightness   Right upper lobe pneumonia [facility])   Discharge Condition: Stable  Disposition:  Pt is discharged home in good condition. Stacy [name] is instructed to increase activity slowly and balance with rest for the next few days, and use prescribed medication to complete treatment of pain  Diet: Regular Wt Readings from Last 3 Encounters:  08/27/17 80.4 kg (177 lb 3.2 oz)  08/21/17 77.1 kg (170 lb)  01/14/17 72.6 kg (160 lb)   History of present illness:  Stacy [name] is a 27 y.o. female with medical history significant of sickle cell disease, type [place] followed by [facility] hematology, left hip avascular necrosis  Presented with   chest pain central since this morning when she woke up. States initially was different from prior sickle cell episodes.  Patient states that she does not take pain medications on a regular basis.  Usually her sickle cell crisis affects lower extremities.  Describes pain as throbbing and nonradiating not associated with any shortness of breath.  Patient was seen in the emergency department 26 May for fevers and was found to have UTI.  She has no prior history of DVT or PE. Pain slightly worse with palpation did not get better with oxycodone only slightly better with Dilaudid IV 6 May patient presented to emergency department with fever and generalized body aches and sore throat strep rapid strep was negative blood culture unremarkable she was found to have UTI and was given dose of Rocephin and then prescription was called in for  Keflex. Unfortunately urine culture was not obtained at the time   Hospital Course:  Patient was  initially admitted for sickle cell pain crisis and managed appropriately with IVF, IV Dilaudid via PCA and IV Toradol, as well as other adjunct therapies per sickle cell pain management protocols. On admission, CTA and CXR showed no PE or acute cardiopulmonary disease. However, on day 3 of admission, patient complained of worsening central chest pain and tightness, CXR revealed RUL Pneumonia. She was started on Vancomycin and Zosyn IV. Her MRSA screen was also positive. She received 72 hours of IV antibiotics and she endorsed significant improvement. Today, she claims her pain is back to baseline, chest pain has resolved, no fever, Hb is stable at close to baseline, she did not require blood transfusion during this admission. She did well on above plan, and was discharged home in a hemodynamically stable condition. She was discharged home on Augmentin 875 mg PO bid for 7 days and 30 tablets of Oxycodone for pain. She will follow up with her PCP and hematologist within one week of this discharge. She was given 5 more days to rest before resuming duties at work on 09/07/2017.   Discharge Exam: Vitals:   09/02/17 0624 09/02/17 0800  BP: 125/85   Pulse: 82   Resp: 20 16  Temp: 99.4 F (37.4 C)   SpO2: 100% 99%   Vitals:   09/02/17 0212 09/02/17 0335 09/02/17 0624 09/02/17 0800  BP: 138/89  125/85   Pulse: 81  82   Resp: 16 16 20 16   Temp: 99 F (37.2 C)  99.4 F (37.4 C)   TempSrc: Oral  SpO2: 100%  100% 99%  Weight:      Height:       General appearance : Awake, alert, not in any distress. Speech Clear. Not toxic looking HEENT: Atraumatic and Normocephalic, pupils equally reactive to light and accomodation Neck: Supple, no JVD. No cervical lymphadenopathy.  Chest: Good air entry bilaterally, no added sounds  CVS: S1 S2 regular, no murmurs.  Abdomen: Bowel sounds present, Non tender and not distended with no gaurding, rigidity or rebound. Extremities: B/L Lower Ext shows no edema, both  legs are warm to touch Neurology: Awake alert, and oriented X 3, CN II-XII intact, Non focal Skin: No Rash  Discharge Instructions  Discharge Instructions    Diet - low sodium heart healthy   Complete by:  As directed    Increase activity slowly   Complete by:  As directed      Allergies as of 09/02/2017      Reactions   Demerol [meperidine] Other (See Comments)   Seizures      Medication List    STOP taking these medications   cephALEXin 500 MG capsule Commonly known as:  KEFLEX   HYDROcodone-homatropine 5-1.5 MG/5ML syrup Commonly known as:  HYCODAN     TAKE these medications   amoxicillin-clavulanate 875-125 MG tablet Commonly known as:  AUGMENTIN Take 1 tablet by mouth 2 (two) times daily for 7 days.   oxyCODONE 15 MG immediate release tablet Commonly known as:  ROXICODONE Take 1 tablet (15 mg total) by mouth every 4 (four) hours as needed for pain.       The results of significant diagnostics from this hospitalization (including imaging, microbiology, ancillary and laboratory) are listed below for reference.    Significant Diagnostic Studies: Dg Chest 2 View  Result Date: 09/02/2017 CLINICAL DATA:  Follow-up right upper lobe pneumonia EXAM: CHEST - 2 VIEW COMPARISON:  Three days ago FINDINGS: Persistent right upper lobe airspace opacity, slightly less dense. No visible cavitation or effusion. Normal heart size and mediastinal contours. IMPRESSION: Right upper lobe pneumonia without progression or effusion. Electronically Signed   By: Marnee Spring M.D.   On: 09/02/2017 09:22   Dg Chest 2 View  Result Date: 08/27/2017 CLINICAL DATA:  Mid chest pain today. EXAM: CHEST - 2 VIEW COMPARISON:  08/21/2017 FINDINGS: The cardiomediastinal silhouette is within normal limits. The lungs are clear. No pleural effusion or pneumothorax is identified. No acute osseous abnormality is seen. IMPRESSION: No active cardiopulmonary disease. Electronically Signed   By: Sebastian Ache M.D.    On: 08/27/2017 14:33   Dg Chest 2 View  Result Date: 08/21/2017 CLINICAL DATA:  Fever, sickle cell crisis EXAM: CHEST - 2 VIEW COMPARISON:  01/13/2017 FINDINGS: The heart size and mediastinal contours are within normal limits. Both lungs are clear. The visualized skeletal structures are unremarkable. IMPRESSION: No active cardiopulmonary disease. Electronically Signed   By: Elige Ko   On: 08/21/2017 23:19   Ct Angio Chest Pe W/cm &/or Wo Cm  Result Date: 08/27/2017 CLINICAL DATA:  Sickle cell patient with mid sternal chest pain beginning today. EXAM: CT ANGIOGRAPHY CHEST WITH CONTRAST TECHNIQUE: Multidetector CT imaging of the chest was performed using the standard protocol during bolus administration of intravenous contrast. Multiplanar CT image reconstructions and MIPs were obtained to evaluate the vascular anatomy. CONTRAST:  77mL ISOVUE-370 IOPAMIDOL (ISOVUE-370) INJECTION 76% COMPARISON:  Chest radiography 08/21/2017.  CT 09/07/2014. FINDINGS: Cardiovascular: Pulmonary arterial opacification is moderate to good. No pulmonary emboli. No aortic pathology. Mild cardiac  enlargement. No pericardial effusion. Mediastinum/Nodes: Normal Lungs/Pleura: Very small pleural effusions layering dependently. The lungs are clear except for a small patchy density at the right apex and in the superior segment of the left lower lobe, most likely to represent scarring. No sign of consolidation or collapse. Upper Abdomen: Normal Musculoskeletal: No acute finding Review of the MIP images confirms the above findings. IMPRESSION: No pulmonary emboli. Tiny pleural effusions layering dependently. Small scars at the right apex and in the superior segment of the right lower lobe. Electronically Signed   By: Paulina Fusi M.D.   On: 08/27/2017 16:07   Dg Chest Port 1 View  Addendum Date: 08/30/2017   ADDENDUM REPORT: 08/30/2017 14:40 ADDENDUM: These results will be called to the ordering clinician or representative by the  Radiologist Assistant, and communication documented in the PACS or zVision Dashboard. Electronically Signed   By: Bary Richard M.D.   On: 08/30/2017 14:40   Result Date: 08/30/2017 CLINICAL DATA:  Sickle cell pt experiencing increased chest tightness and abd discomfort since admission 3 days ago - nonsmoker EXAM: PORTABLE CHEST 1 VIEW COMPARISON:  Chest x-ray dated 08/27/2017. FINDINGS: Ill-defined opacity within the RIGHT upper lobe, consistent with pneumonia. Lungs are otherwise clear. No pleural effusion or pneumothorax seen. Borderline cardiomegaly is stable. IMPRESSION: RIGHT upper lobe pneumonia. Electronically Signed: By: Bary Richard M.D. On: 08/30/2017 14:36    Microbiology: Recent Results (from the past 240 hour(s))  MRSA PCR Screening     Status: Abnormal   Collection Time: 08/31/17  7:18 PM  Result Value Ref Range Status   MRSA by PCR POSITIVE (A) NEGATIVE Final    Comment:        The GeneXpert MRSA Assay (FDA approved for NASAL specimens only), is one component of a comprehensive MRSA colonization surveillance program. It is not intended to diagnose MRSA infection nor to guide or monitor treatment for MRSA infections. RESULT CALLED TO, READ BACK BY AND VERIFIED WITH: Langley Adie CERIC,RN [id number] @ 2045 BY J SCOTTON Performed at [facility], [address] [place], [place] [address]      Labs: Basic Metabolic Panel: Recent Labs  Lab 08/27/17 1442 08/28/17 0332 08/29/17 0543 08/30/17 1207 09/01/17 0458  NA 138  --  137 138 139  K 3.8  --  3.1* 3.6 3.6  CL 107  --  103 105 103  CO2 24  --  25 27 29   GLUCOSE 94  --  97 107* 117*  BUN 7  --  5* 5* 5*  CREATININE 0.72  --  0.66 0.67 0.84  CALCIUM 8.9  --  8.6* 8.8* 8.9  MG  --  1.8  --   --   --   PHOS  --  4.6  --   --   --    Liver Function Tests: Recent Labs  Lab 08/27/17 1442 08/29/17 0543 09/01/17 0458  AST 20 19 21   ALT 21 16 16   ALKPHOS 50 64 56  BILITOT 0.5 1.5* 2.1*  PROT 7.4 6.7  7.2  ALBUMIN 4.1 3.8 3.5   No results for input(s): LIPASE, AMYLASE in the last 168 hours. No results for input(s): AMMONIA in the last 168 hours. CBC: Recent Labs  Lab 08/27/17 1442 08/28/17 0332 08/29/17 0543 08/30/17 1207 09/01/17 0458  WBC 8.9 7.1 7.9 15.3* 9.1  NEUTROABS 4.8 4.8 6.1 13.2* 6.4  HGB 10.5* 8.6* 8.5* 8.0* 7.5*  HCT 28.2* 24.3* 23.6* 22.1* 21.9*  MCV 73.6* 74.5* 74.4*  72.5* 74.5*  PLT 293 173 152 163 194   Cardiac Enzymes: Recent Labs  Lab 08/27/17 1422 08/27/17 1810 08/27/17 2118 08/28/17 0332 08/28/17 0859  TROPONINI <0.03 <0.03 <0.03 <0.03 <0.03   BNP: Invalid input(s): POCBNP CBG: No results for input(s): GLUCAP in the last 168 hours.  Time coordinating discharge: 50 minutes  Signed:  Jerrianne Hartin  [facility] 09/02/2017, 11:21 AM

## 2017-09-02 NOTE — Progress Notes (Signed)
No changes from am assessment. Pt remains a&ox4, ambulatory without assistance. Discharge instructions reviewed, questions, concerns denied.

## 2017-09-02 NOTE — Discharge Instructions (Signed)
Sickle Cell Anemia, Adult °Sickle cell anemia is a condition in which red blood cells have an abnormal “sickle” shape. This abnormal shape shortens the cells’ life span, which results in a lower than normal concentration of red blood cells in the blood. The sickle shape also causes the cells to clump together and block free blood flow through the blood vessels. As a result, the tissues and organs of the body do not receive enough oxygen. Sickle cell anemia causes organ damage and pain and increases the risk of infection. °What are the causes? °Sickle cell anemia is a genetic disorder. Those who receive two copies of the gene have the condition, and those who receive one copy have the trait. °What increases the risk? °The sickle cell gene is most common in people whose families originated in [place]. Other areas of the globe where sickle cell trait occurs include the Mediterranean, [place] and [place], the [place], and the [place]. °What are the signs or symptoms? °· Pain, especially in the extremities, back, chest, or abdomen (common). The pain may start suddenly or may develop following an illness, especially if there is dehydration. Pain can also occur due to overexertion or exposure to extreme temperature changes. °· Frequent severe bacterial infections, especially certain types of pneumonia and meningitis. °· Pain and swelling in the hands and feet. °· Decreased activity. °· Loss of appetite. °· Change in behavior. °· Headaches. °· Seizures. °· Shortness of breath or difficulty breathing. °· Vision changes. °· Skin ulcers. °Those with the trait may not have symptoms or they may have mild symptoms. °How is this diagnosed? °Sickle cell anemia is diagnosed with blood tests that demonstrate the genetic trait. It is often diagnosed during the newborn period, due to mandatory testing nationwide. A variety of blood tests, X-rays, CT scans, MRI scans, ultrasounds, and lung function tests may also be done to  monitor the condition. °How is this treated? °Sickle cell anemia may be treated with: °· Medicines. You may be given pain medicines, antibiotic medicines (to treat and prevent infections) or medicines to increase the production of certain types of hemoglobin. °· Fluids. °· Oxygen. °· Blood transfusions. ° °Follow these instructions at home: °· Drink enough fluid to keep your urine clear or pale yellow. Increase your fluid intake in hot weather and during exercise. °· Do not smoke. Smoking lowers oxygen levels in the blood. °· Only take over-the-counter or prescription medicines for pain, fever, or discomfort as directed by your health care provider. °· Take antibiotics as directed by your health care provider. Make sure you finish them it even if you start to feel better. °· Take supplements as directed by your health care provider. °· Consider wearing a medical alert bracelet. This tells anyone caring for you in an emergency of your condition. °· When traveling, keep your medical information, health care provider's names, and the medicines you take with you at all times. °· If you develop a fever, do not take medicines to reduce the fever right away. This could cover up a problem that is developing. Notify your health care provider. °· Keep all follow-up appointments with your health care provider. Sickle cell anemia requires regular medical care. °Contact a health care provider if: °You have a fever. °Get help right away if: °· You feel dizzy or faint. °· You have new abdominal pain, especially on the left side near the stomach area. °· You develop a persistent, often uncomfortable and painful penile erection (priapism). If this is not   treated immediately it will lead to impotence. °· You have numbness your arms or legs or you have a hard time moving them. °· You have a hard time with speech. °· You have a fever or persistent symptoms for more than 2-3 days. °· You have a fever and your symptoms suddenly get  worse. °· You have signs or symptoms of infection. These include: °? Chills. °? Abnormal tiredness (lethargy). °? Irritability. °? Poor eating. °? Vomiting. °· You develop pain that is not helped with medicine. °· You develop shortness of breath. °· You have pain in your chest. °· You are coughing up pus-like or bloody sputum. °· You develop a stiff neck. °· Your feet or hands swell or have pain. °· Your abdomen appears bloated. °· You develop joint pain. °This information is not intended to replace advice given to you by your health care provider. Make sure you discuss any questions you have with your health care provider. °Document Released: 06/23/2005 Document Revised: 10/03/2015 Document Reviewed: 10/25/2012 °Elsevier Interactive Patient Education © 2017 [employer] ° °

## 2018-11-24 IMAGING — CT CT ANGIO CHEST
2 of 8 series · 19 of 36 positions shown · IV contrast (iopamidol)
Comparison: Chest radiography 08/21/2017.  CT 09/07/2014.

CLINICAL DATA: Sickle cell patient with mid sternal chest pain
beginning today.

EXAM:
CT ANGIOGRAPHY CHEST WITH CONTRAST
TECHNIQUE: Multidetector CT imaging of the chest was performed using the
standard protocol during bolus administration of intravenous
contrast. Multiplanar CT image reconstructions and MIPs were
obtained to evaluate the vascular anatomy.
CONTRAST:  77mL [id number] IOPAMIDOL ([id number]) INJECTION 76%

[Series 6: pe thins · axial · 0.70mm/px · z∈[-247,-27]mm · 18 of 246 slices shown]
[im 13/246  lung]
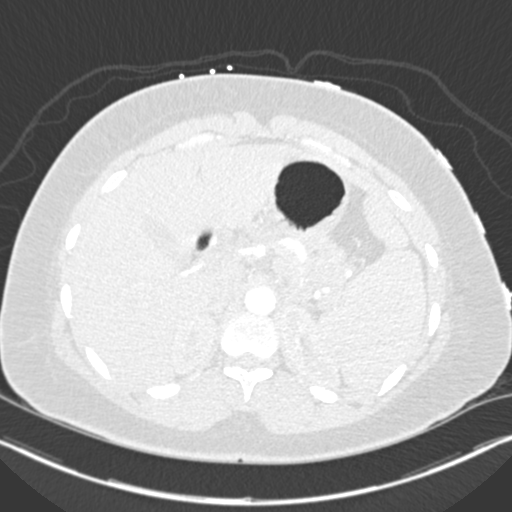
[im 26/246  mediastinal]
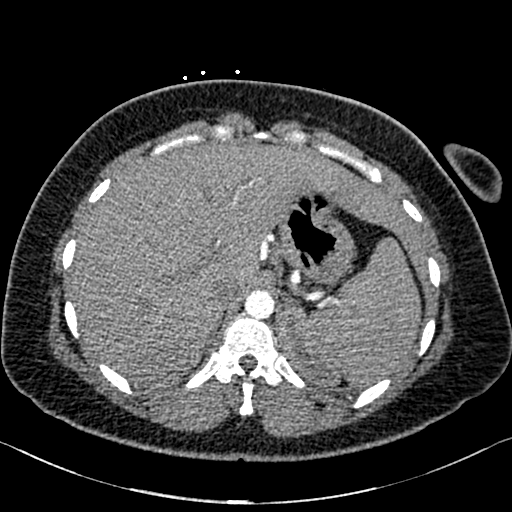
[im 39/246  lung]
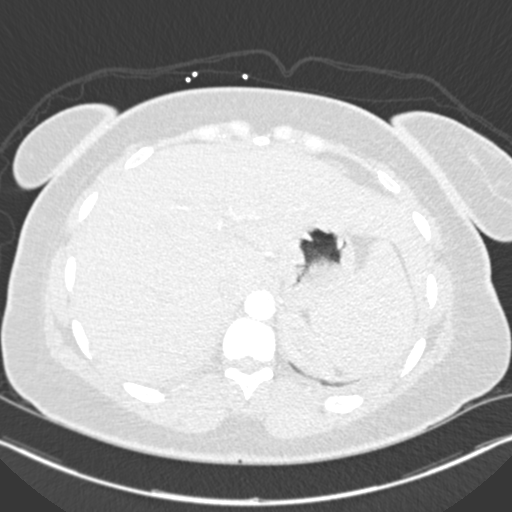
[im 52/246  mediastinal]
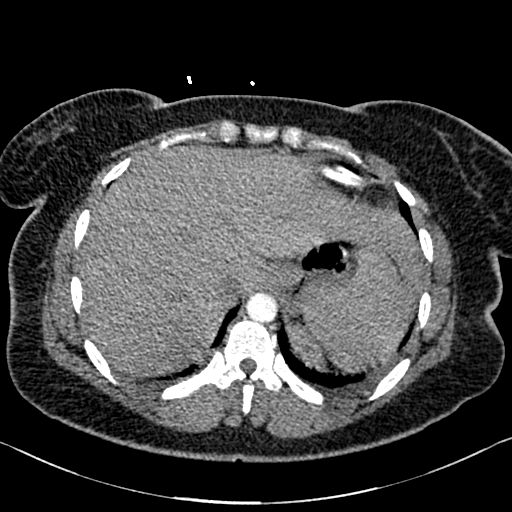
[im 65/246  lung]
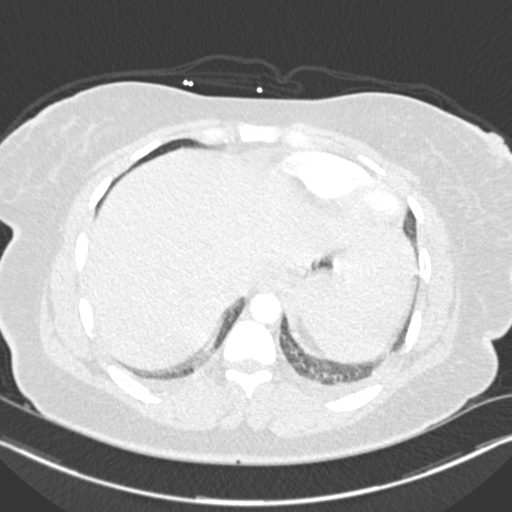
[im 78/246  mediastinal]
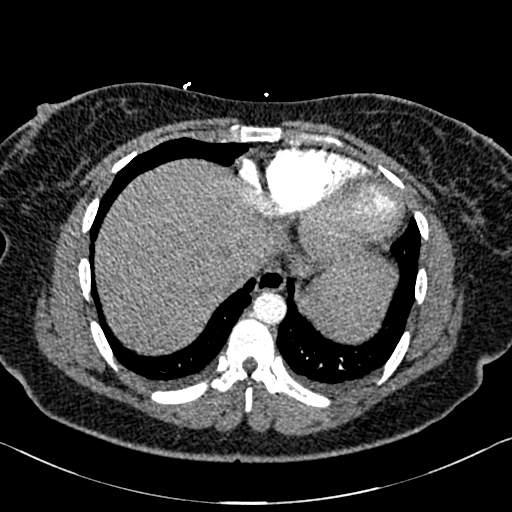
[im 91/246  lung]
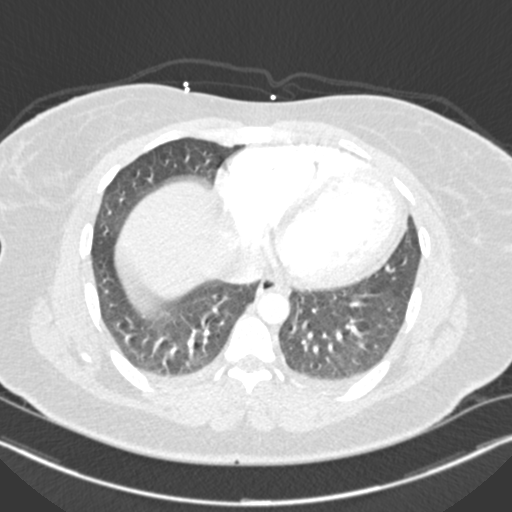
[im 104/246  mediastinal]
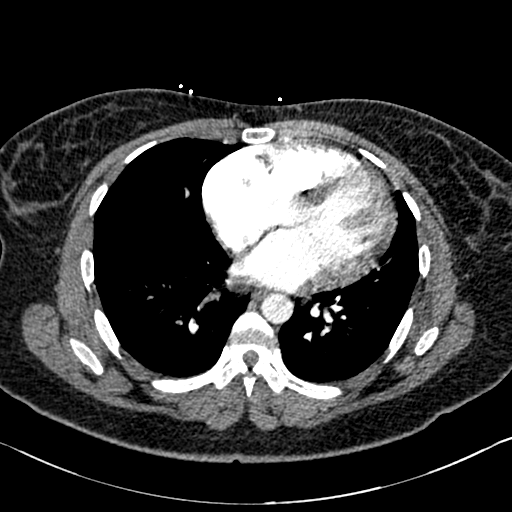
[im 117/246  lung]
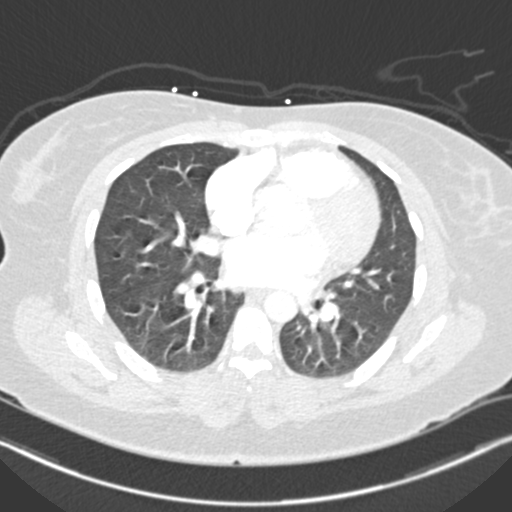
[im 129/246  mediastinal]
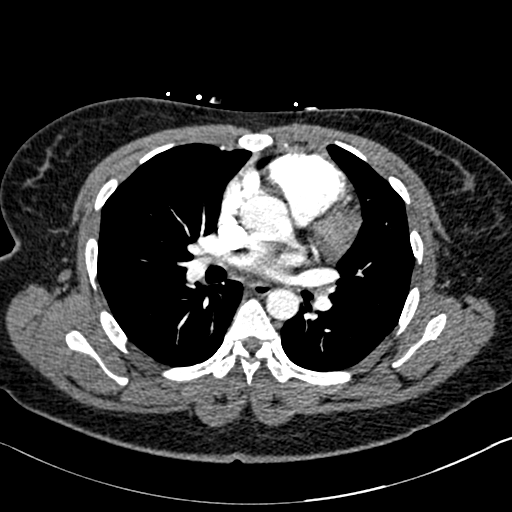
[im 142/246  lung]
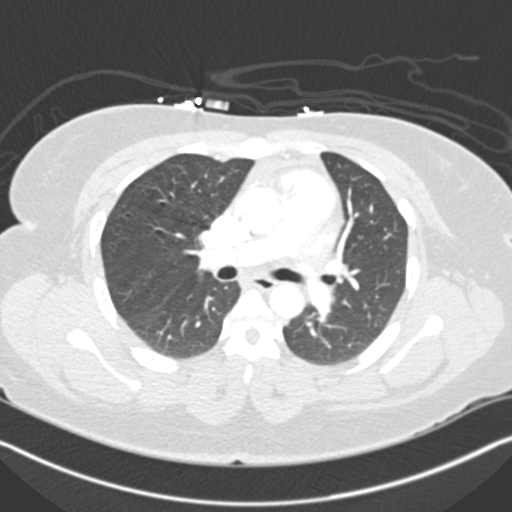
[im 155/246  mediastinal]
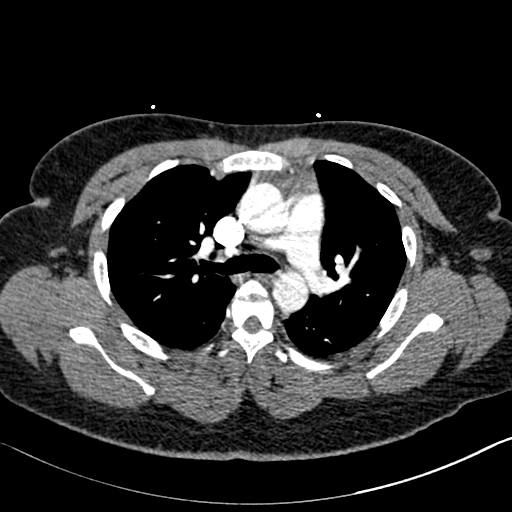
[im 168/246  lung]
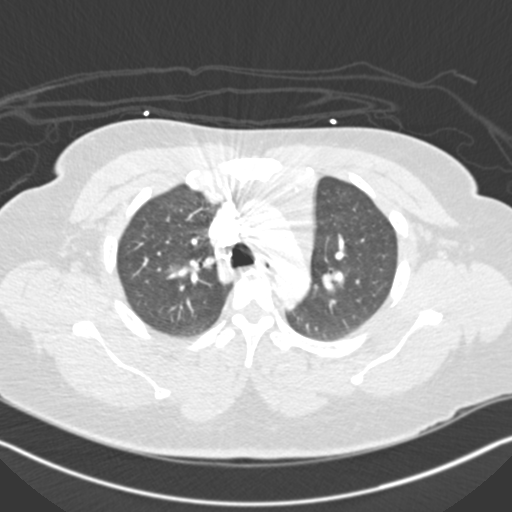
[im 181/246  mediastinal]
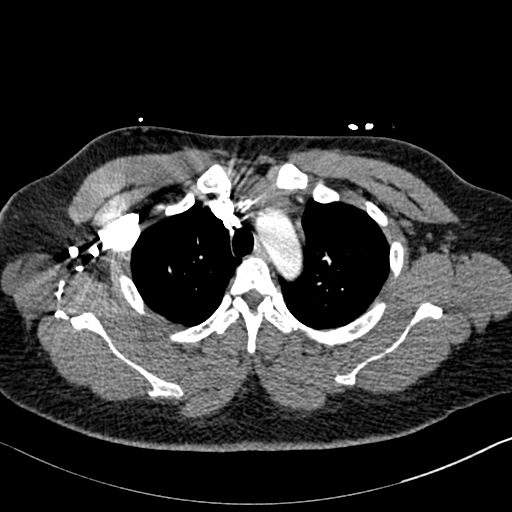
[im 194/246  lung]
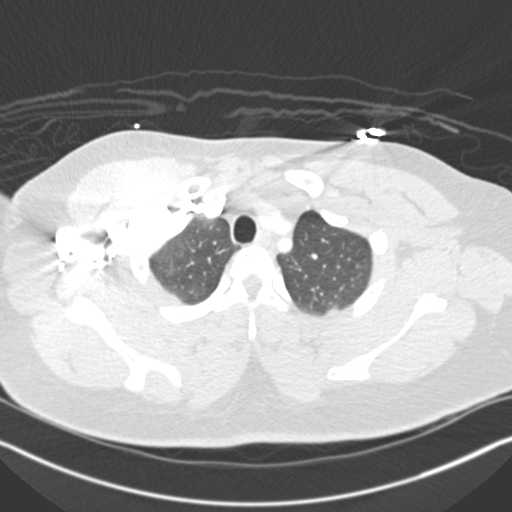
[im 207/246  mediastinal]
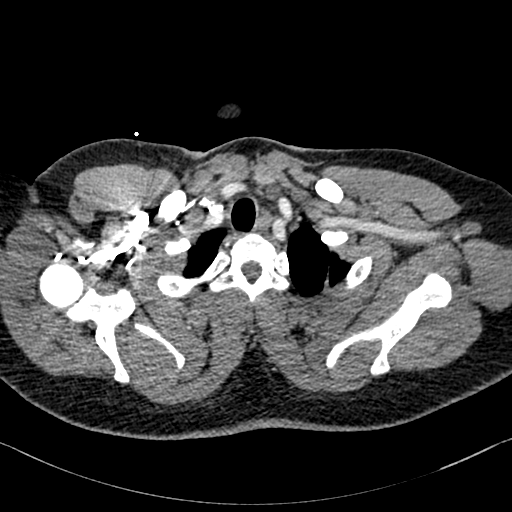
[im 220/246  lung]
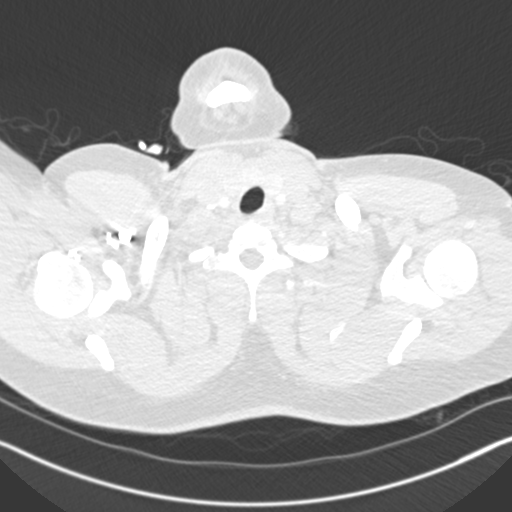
[im 233/246  mediastinal]
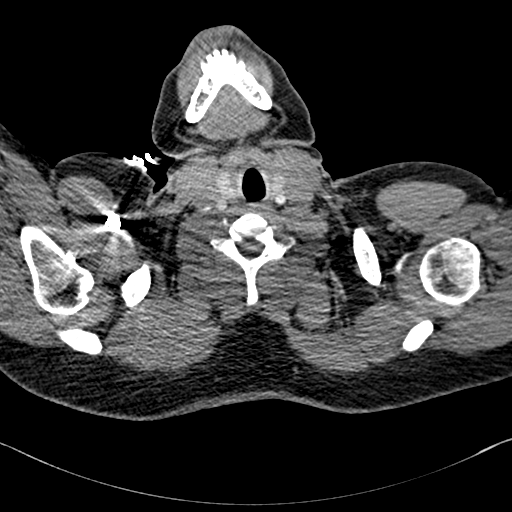

[Series 7: pe coronal mpr · coronal · 0.59mm/px · 1 of 151 slices shown]
[im 76/151  mediastinal]
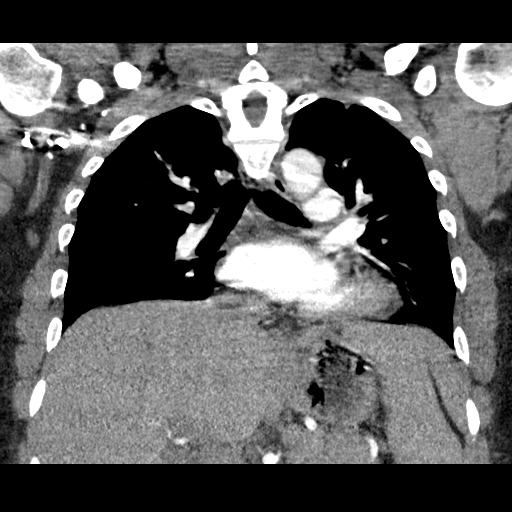

[19 of 36 positions shown; findings below may reference images not displayed]

FINDINGS: Cardiovascular: Pulmonary arterial opacification is moderate to
good. No pulmonary emboli. No aortic pathology. Mild cardiac
enlargement. No pericardial effusion.

Mediastinum/Nodes: Normal

Lungs/Pleura: Very small pleural effusions layering dependently. The
lungs are clear except for a small patchy density at the right apex
and in the superior segment of the left lower lobe, most likely to
represent scarring. No sign of consolidation or collapse.

Upper Abdomen: Normal

Musculoskeletal: No acute finding

Review of the MIP images confirms the above findings.
IMPRESSION: No pulmonary emboli. Tiny pleural effusions layering dependently.
Small scars at the right apex and in the superior segment of the
right lower lobe.

## 2018-11-30 IMAGING — DX DG CHEST 2V
2 series · 2 of 2 positions shown · non-contrast
Comparison: Three days ago

CLINICAL DATA: Follow-up right upper lobe pneumonia

EXAM:
CHEST - 2 VIEW

[chest lat]
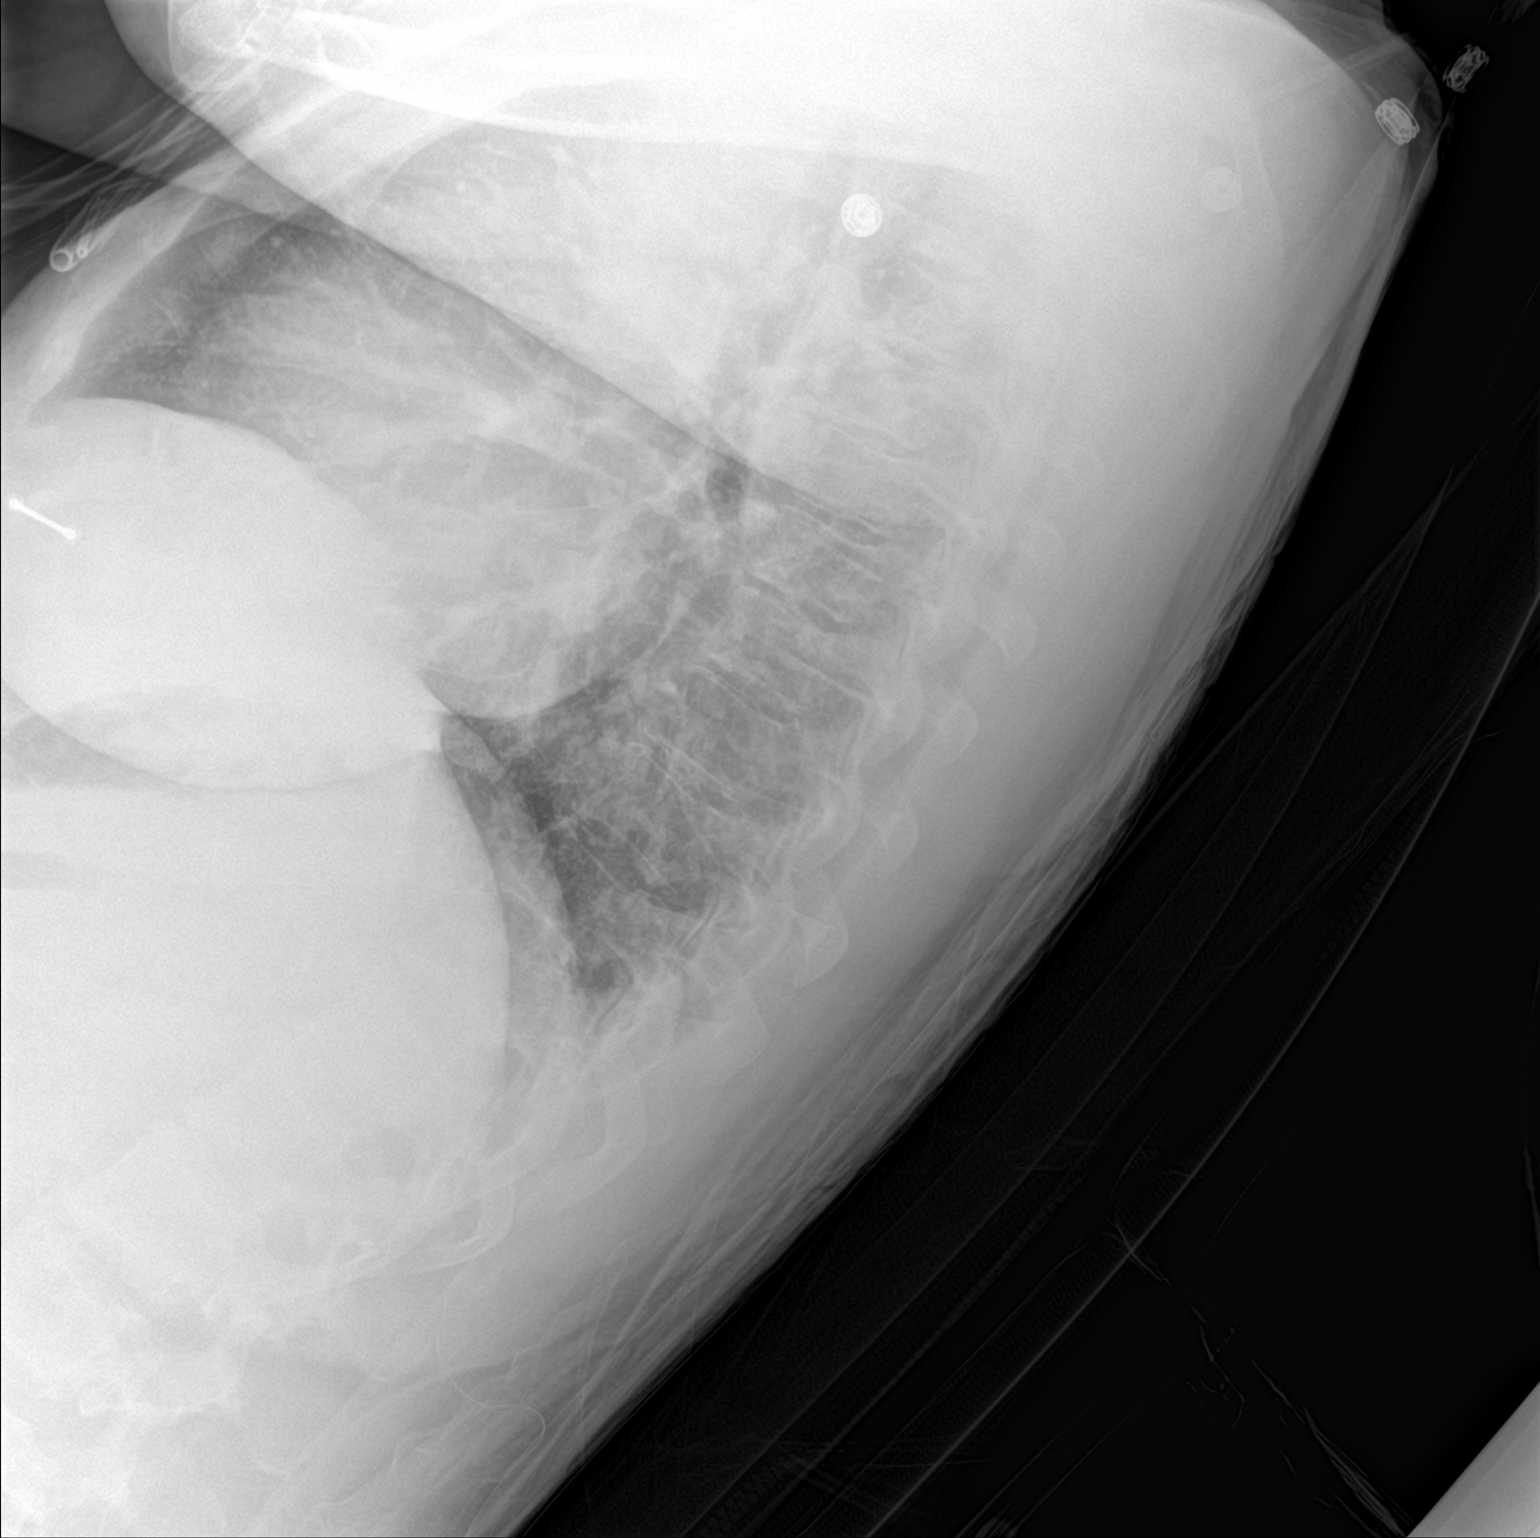

[chest ap]
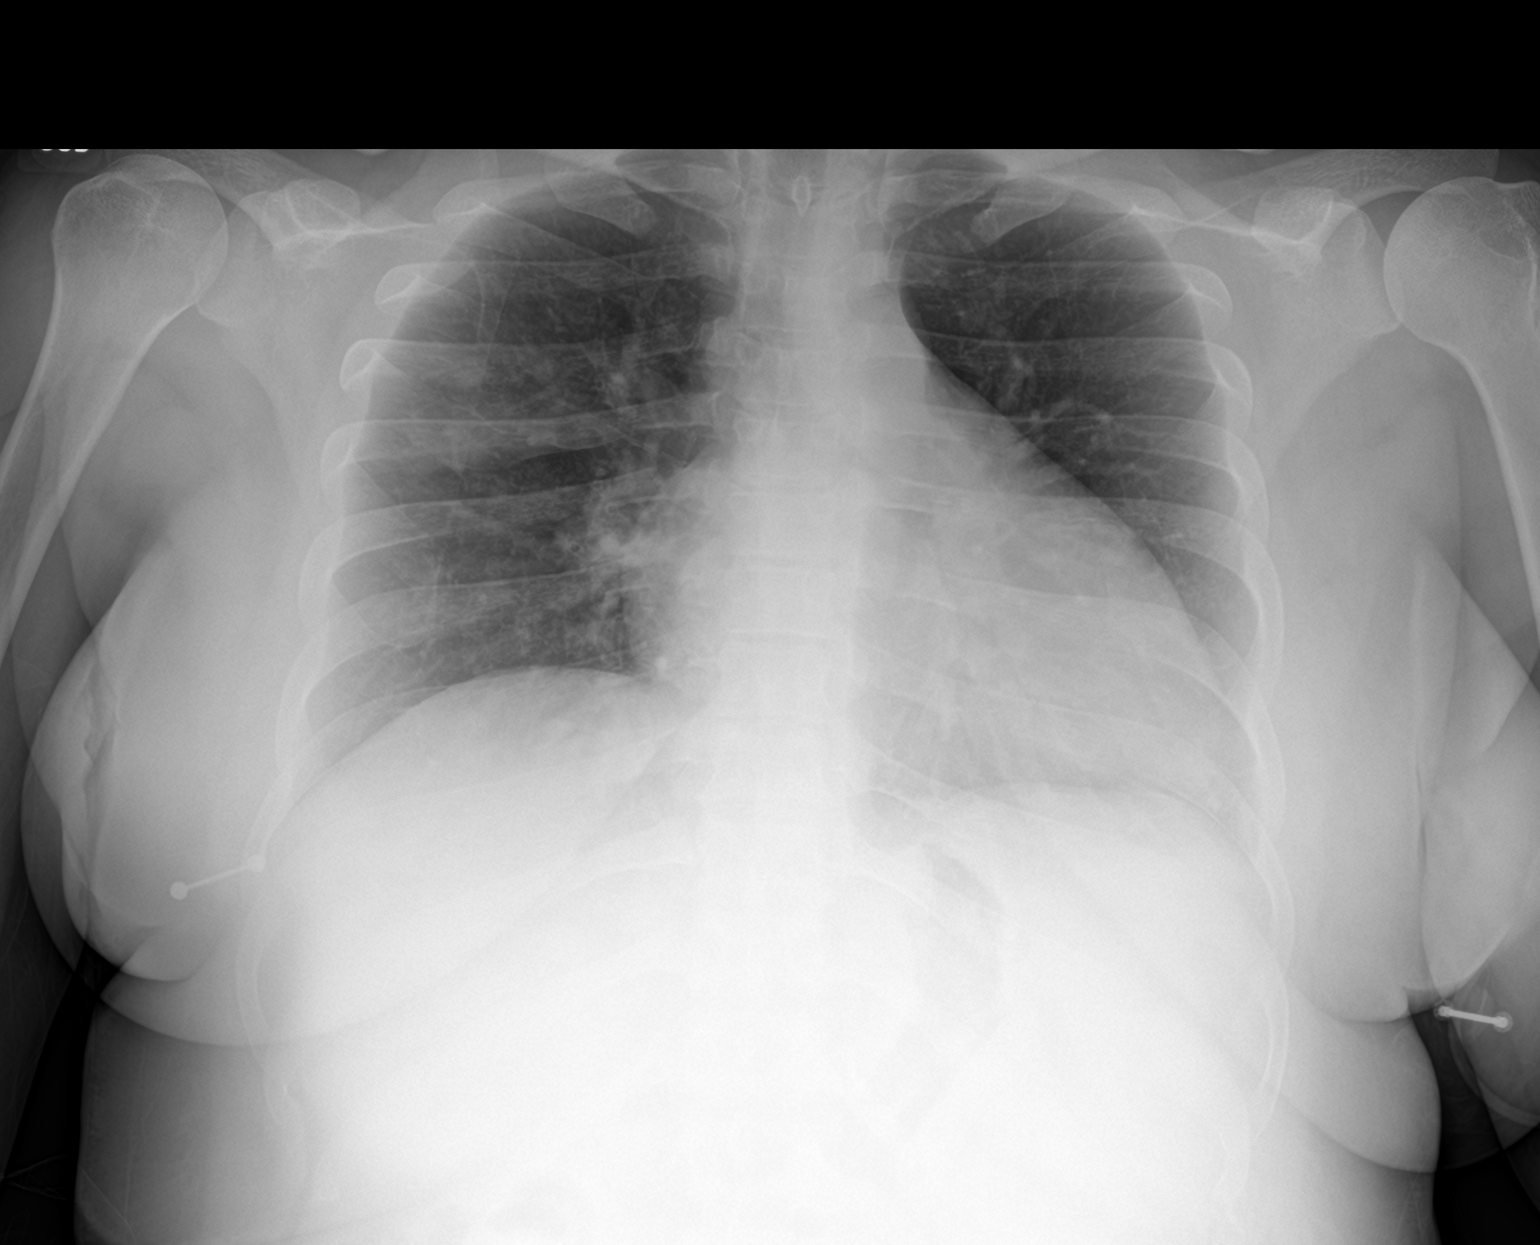

[2 of 2 positions shown; findings below may reference images not displayed]

FINDINGS: Persistent right upper lobe airspace opacity, slightly less dense.
No visible cavitation or effusion. Normal heart size and mediastinal
contours.
IMPRESSION: Right upper lobe pneumonia without progression or effusion.

## 2019-02-26 ENCOUNTER — Encounter (HOSPITAL_BASED_OUTPATIENT_CLINIC_OR_DEPARTMENT_OTHER): Payer: Self-pay | Admitting: *Deleted

## 2019-02-26 ENCOUNTER — Other Ambulatory Visit: Payer: Self-pay

## 2019-02-26 ENCOUNTER — Emergency Department (HOSPITAL_BASED_OUTPATIENT_CLINIC_OR_DEPARTMENT_OTHER)
Admission: EM | Admit: 2019-02-26 | Discharge: 2019-02-26 | Disposition: A | Discharge status: Home / Self Care | Payer: BLUE CROSS/BLUE SHIELD | Attending: Emergency Medicine | Admitting: Emergency Medicine

## 2019-02-26 DIAGNOSIS — D571 Sickle-cell disease without crisis: Secondary | ICD-10-CM | POA: Insufficient documentation

## 2019-02-26 DIAGNOSIS — R42 Dizziness and giddiness: Secondary | ICD-10-CM | POA: Insufficient documentation

## 2019-02-26 DIAGNOSIS — Z20828 Contact with and (suspected) exposure to other viral communicable diseases: Secondary | ICD-10-CM | POA: Insufficient documentation

## 2019-02-26 LAB — SARS CORONAVIRUS 2 AG (30 MIN TAT): SARS Coronavirus 2 Ag: NEGATIVE

## 2019-02-26 LAB — CBC WITH DIFFERENTIAL/PLATELET
Abs Immature Granulocytes: 0.02 10*3/uL (ref 0.00–0.07)
Basophils Absolute: 0 10*3/uL (ref 0.0–0.1)
Basophils Relative: 0 %
Eosinophils Absolute: 0 10*3/uL (ref 0.0–0.5)
Eosinophils Relative: 0 %
HCT: 30.3 % — ABNORMAL LOW (ref 36.0–46.0)
Hemoglobin: 10.9 g/dL — ABNORMAL LOW (ref 12.0–15.0)
Immature Granulocytes: 0 %
Lymphocytes Relative: 23 %
Lymphs Abs: 1.4 10*3/uL (ref 0.7–4.0)
MCH: 26.6 pg (ref 26.0–34.0)
MCHC: 36 g/dL (ref 30.0–36.0)
MCV: 73.9 fL — ABNORMAL LOW (ref 80.0–100.0)
Monocytes Absolute: 0.4 10*3/uL (ref 0.1–1.0)
Monocytes Relative: 8 %
Neutro Abs: 4 10*3/uL (ref 1.7–7.7)
Neutrophils Relative %: 69 %
Platelets: 184 10*3/uL (ref 150–400)
RBC: 4.1 MIL/uL (ref 3.87–5.11)
RDW: 15.9 % — ABNORMAL HIGH (ref 11.5–15.5)
WBC: 5.8 10*3/uL (ref 4.0–10.5)
nRBC: 0 % (ref 0.0–0.2)

## 2019-02-26 LAB — CBG MONITORING, ED: Glucose-Capillary: 98 mg/dL (ref 70–99)

## 2019-02-26 LAB — BASIC METABOLIC PANEL
Anion gap: 5 (ref 5–15)
BUN: 12 mg/dL (ref 6–20)
CO2: 19 mmol/L — ABNORMAL LOW (ref 22–32)
Calcium: 9.4 mg/dL (ref 8.9–10.3)
Chloride: 109 mmol/L (ref 98–111)
Creatinine, Ser: 0.68 mg/dL (ref 0.44–1.00)
GFR calc Af Amer: >60 mL/min (ref >60)
GFR calc non Af Amer: >60 mL/min (ref >60)
Glucose, Bld: 106 mg/dL — ABNORMAL HIGH (ref 70–99)
Potassium: 3.9 mmol/L (ref 3.5–5.1)
Sodium: 133 mmol/L — ABNORMAL LOW (ref 135–145)

## 2019-02-26 LAB — PREGNANCY, URINE: Preg Test, Ur: NEGATIVE

## 2019-02-26 MED ORDER — DIAZEPAM 2 MG PO TABS
2.0000 mg | ORAL_TABLET | Freq: Once | ORAL | Status: AC
  Administered 2019-02-26: 2 mg via ORAL
  Filled 2019-02-26: qty 1

## 2019-02-26 MED ORDER — MECLIZINE HCL 25 MG PO TABS
25.0000 mg | ORAL_TABLET | Freq: Once | ORAL | Status: AC
  Administered 2019-02-26: 25 mg via ORAL
  Filled 2019-02-26: qty 1

## 2019-02-26 MED ORDER — SODIUM CHLORIDE 0.9% FLUSH
3.0000 mL | Freq: Once | INTRAVENOUS | Status: DC
  Filled 2019-02-26: qty 3

## 2019-02-26 MED ORDER — ONDANSETRON 4 MG PO TBDP: 4.0000 mg | ORAL_TABLET | Freq: Three times a day (TID) | ORAL | 0 refills | Status: DC | PRN

## 2019-02-26 MED ORDER — MECLIZINE HCL 25 MG PO TABS: 25.0000 mg | ORAL_TABLET | Freq: Two times a day (BID) | ORAL | 0 refills | Status: AC

## 2019-02-26 MED ORDER — ONDANSETRON 4 MG PO TBDP
4.0000 mg | ORAL_TABLET | Freq: Three times a day (TID) | ORAL | Status: DC | PRN
  Administered 2019-02-26: 4 mg via ORAL
  Filled 2019-02-26: qty 1

## 2019-02-26 NOTE — ED Notes (Signed)
ED Provider at bedside. 

## 2019-02-26 NOTE — Discharge Instructions (Signed)
You were diagnosed with vertigo today.  This is a feeling that the room is spinning around you.  I think this may be caused by condition on the inside of your ear, which typically gets better after several days.  I prescribed some medicine to try to help with your symptoms.  This medicine will not completely take away your symptoms.  You will likely continue to have vertigo for several days.  If you feel your symptoms become severe, or you begin having vomiting that cannot get under control, you should return to the emergency department for further care.  Likewise if you develop numbness, weakness, or severe headache, you should return to the ER.

## 2019-02-26 NOTE — ED Provider Notes (Signed)
[facility] EMERGENCY DEPARTMENT Provider Note   CSN: [id number] Arrival date & time: 02/26/19  0908     History   Chief Complaint Chief Complaint  Patient presents with  . Dizziness    HPI Stacy [name] is a 28 y.o. female history of sickle cell anemia presenting to emergency department with abrupt onset dizziness.  Patient reports that she developed vertigo acutely while at work yesterday.  She describes sensation of the room spinning around her.  This is worse with movement and better when she is lying still with her eyes closed.  She has never had this before.  She denies any headache.  She reports some nausea but no vomiting.  She denies any neck pain or stiffness.  She denies any fevers or chills.  She denies any ear pain or drainage or history of ear infections.  She denies any visual changes.  She denies any personal history of stroke.     HPI  Past Medical History:  Diagnosis Date  . Sickle cell anemia [facility])     Patient Active Problem List   Diagnosis Date Noted  . Right upper lobe pneumonia   . HCAP (healthcare-associated pneumonia)   . Chest tightness   . Reticulocytosis   . Sickle cell crisis (HCC) 08/27/2017  . Central chest pain 08/27/2017    Past Surgical History:  Procedure Laterality Date  . CESAREAN SECTION       OB History    Gravida  1   Para      Term      Preterm      AB      Living        SAB      TAB      Ectopic      Multiple      Live Births               Home Medications    Prior to Admission medications   Medication Sig Start Date End Date Taking? Authorizing Provider  meclizine (ANTIVERT) 25 MG tablet Take 1 tablet (25 mg total) by mouth 2 (two) times daily for 30 doses. 02/26/19 03/13/19  Terald Sleeper, MD  ondansetron (ZOFRAN ODT) 4 MG disintegrating tablet Take 1 tablet (4 mg total) by mouth every 8 (eight) hours as needed for up to 15 doses for nausea or vomiting. 02/26/19   Terald Sleeper, MD  oxyCODONE (ROXICODONE) 15 MG immediate release tablet Take 1 tablet (15 mg total) by mouth every 4 (four) hours as needed for pain. 09/02/17   Quentin Angst, MD    Family History Family History  Problem Relation Age of Onset  . Stroke Other   . CAD Other   . Sickle cell anemia Other   . Diabetes Neg Hx     Social History Social History   Tobacco Use  . Smoking status: Never Smoker  . Smokeless tobacco: Never Used  Substance Use Topics  . Alcohol use: No  . Drug use: No     Allergies   Demerol [meperidine]   Review of Systems Review of Systems  Constitutional: Negative for chills and fever.  HENT: Negative for ear discharge and ear pain.   Eyes: Negative for photophobia, pain, redness and visual disturbance.  Respiratory: Negative for cough and shortness of breath.   Cardiovascular: Negative for chest pain and palpitations.  Gastrointestinal: Positive for nausea. Negative for abdominal pain and vomiting.  Musculoskeletal: Negative for neck pain  and neck stiffness.  Skin: Negative for pallor and rash.  Neurological: Positive for dizziness and light-headedness. Negative for seizures, syncope, facial asymmetry, speech difficulty, numbness and headaches.  Psychiatric/Behavioral: Negative for agitation and confusion.  All other systems reviewed and are negative.    Physical Exam Updated Vital Signs BP 128/89 (BP Location: Right Arm)   Pulse 70   Temp 98.8 F (37.1 C) (Oral)   Resp 13   Ht 5' (1.524 m)   Wt 80.4 kg   LMP 02/08/2019   SpO2 99%   BMI 34.62 kg/m   Physical Exam Vitals signs and nursing note reviewed.  Constitutional:      General: She is not in acute distress.    Appearance: She is well-developed.  HENT:     Head: Normocephalic and atraumatic.     Right Ear: Tympanic membrane, ear canal and external ear normal.     Left Ear: Tympanic membrane, ear canal and external ear normal.     Mouth/Throat:     Mouth: Mucous  membranes are moist.  Eyes:     Conjunctiva/sclera: Conjunctivae normal.     Pupils: Pupils are equal, round, and reactive to light.  Neck:     Musculoskeletal: Normal range of motion and neck supple. No neck rigidity.  Cardiovascular:     Rate and Rhythm: Normal rate and regular rhythm.     Heart sounds: No murmur.  Pulmonary:     Effort: Pulmonary effort is normal. No respiratory distress.     Breath sounds: Normal breath sounds.  Skin:    General: Skin is warm and dry.  Neurological:     Mental Status: She is alert and oriented to person, place, and time. Mental status is at baseline.     Cranial Nerves: No cranial nerve deficit.     Sensory: No sensory deficit.     Motor: No weakness.     Comments: Nystagmus is present (Unidirectional horizontal with leftward gaze)  Psychiatric:        Mood and Affect: Mood normal.        Behavior: Behavior normal.      ED Treatments / Results  Labs (all labs ordered are listed, but only abnormal results are displayed) Labs Reviewed  BASIC METABOLIC PANEL - Abnormal; Notable for the following components:      Result Value   Sodium 133 (*)    CO2 19 (*)    Glucose, Bld 106 (*)    All other components within normal limits  CBC WITH DIFFERENTIAL/PLATELET - Abnormal; Notable for the following components:   Hemoglobin 10.9 (*)    HCT 30.3 (*)    MCV 73.9 (*)    RDW 15.9 (*)    All other components within normal limits  SARS CORONAVIRUS 2 AG (30 MIN TAT)  PREGNANCY, URINE  CBG MONITORING, ED    EKG EKG Interpretation  Date/Time:  Monday February 26 2019 09:37:40 EST Ventricular Rate:  60 PR Interval:    QRS Duration: 94 QT Interval:  406 QTC Calculation: 406 R Axis:   44 Text Interpretation: Sinus rhythm Nonspecific T abnormalities, anterior leads No STEMI Confirmed by Alvester Chourifan, Malakye Nolden [phone]([id number]) on 02/26/2019 9:46:09 AM   Radiology No results found.  Procedures Procedures (including critical care time)  Medications  Ordered in ED Medications  meclizine (ANTIVERT) tablet 25 mg (25 mg Oral Given 02/26/19 0953)  diazepam (VALIUM) tablet 2 mg (2 mg Oral Given 02/26/19 0954)     Initial Impression / Assessment and  Plan / ED Course  I have reviewed the triage vital signs and the nursing notes.  Pertinent labs & imaging results that were available during my care of the patient were reviewed by me and considered in my medical decision making (see chart for details).  28 year old female presenting with abrupt onset vertigo that began at work yesterday.  It is extremely positional.  On exam she does demonstrate horizontal nystagmus with leftward gaze.  Is most consistent with peripheral cause of vertigo such as BPPV.  She has no hearing loss or pain in her ear suggest an infection, labyrinth-itis, or Mnire's disease.  She has no other signs or symptoms of stroke or neurological deficits on exam.  She has no neck pain or recent history of manipulation to suggest this is a vertebral arterial injury or posterior circulation injury.  She has no headache to suggest a subarachnoid hemorrhage.  No signs or symptoms of meningitis.  Given that most cases of BPPV are triggered by viruses, I believe it is reasonable to obtain a Covid test today.  She has no other signs or symptoms of Covid at this time.  I do not believe that the symptoms are related all to her anemia.  She is not having any chest pain.  She does not feel like these symptoms are typical of her sickle cell disease.  This note was dictated using dragon dictation software.  Please be aware that there may be minor translation errors as a result of this oral dictation  Stacy [name] was evaluated in Emergency Department on 02/26/2019 for the symptoms described in the history of present illness. She was evaluated in the context of the global COVID-19 pandemic, which necessitated consideration that the patient might be at risk for infection with the SARS-CoV-2  virus that causes COVID-19. Institutional protocols and algorithms that pertain to the evaluation of patients at risk for COVID-19 are in a state of rapid change based on information released by regulatory bodies including the CDC and federal and state organizations. These policies and algorithms were followed during the patient's care in the ED.  Clinical Course as of Feb 26 1748  Mon Feb 26, 2019  1138 Patient is reporting some improvement of her vertigo after medications.  Will discharge with meclizine and Zofran.   [MT]    Clinical Course User Index [MT] Wyvonnia Dusky, MD     Final Clinical Impressions(s) / ED Diagnoses   Final diagnoses:  Vertigo    ED Discharge Orders         Ordered    meclizine (ANTIVERT) 25 MG tablet  2 times daily     02/26/19 1139    ondansetron (ZOFRAN ODT) 4 MG disintegrating tablet  Every 8 hours PRN     02/26/19 1139           Wyvonnia Dusky, MD 02/26/19 1749

## 2019-02-26 NOTE — ED Triage Notes (Signed)
Pt reports dizziness with nausea x 2 days. Denies injury to head, denies pain.

## 2019-11-12 ENCOUNTER — Emergency Department (HOSPITAL_BASED_OUTPATIENT_CLINIC_OR_DEPARTMENT_OTHER)
Admission: EM | Admit: 2019-11-12 | Discharge: 2019-11-12 | Disposition: A | Discharge status: Home / Self Care | Payer: BC Managed Care – PPO | Attending: Emergency Medicine | Admitting: Emergency Medicine

## 2019-11-12 ENCOUNTER — Encounter (HOSPITAL_BASED_OUTPATIENT_CLINIC_OR_DEPARTMENT_OTHER): Payer: Self-pay

## 2019-11-12 ENCOUNTER — Other Ambulatory Visit: Payer: Self-pay

## 2019-11-12 DIAGNOSIS — R110 Nausea: Secondary | ICD-10-CM | POA: Diagnosis not present

## 2019-11-12 DIAGNOSIS — R079 Chest pain, unspecified: Secondary | ICD-10-CM | POA: Insufficient documentation

## 2019-11-12 DIAGNOSIS — U071 COVID-19: Secondary | ICD-10-CM | POA: Insufficient documentation

## 2019-11-12 DIAGNOSIS — R5383 Other fatigue: Secondary | ICD-10-CM | POA: Insufficient documentation

## 2019-11-12 DIAGNOSIS — R197 Diarrhea, unspecified: Secondary | ICD-10-CM | POA: Insufficient documentation

## 2019-11-12 DIAGNOSIS — R439 Unspecified disturbances of smell and taste: Secondary | ICD-10-CM | POA: Insufficient documentation

## 2019-11-12 DIAGNOSIS — R05 Cough: Secondary | ICD-10-CM | POA: Diagnosis present

## 2019-11-12 DIAGNOSIS — Z20822 Contact with and (suspected) exposure to covid-19: Secondary | ICD-10-CM

## 2019-11-12 DIAGNOSIS — R0981 Nasal congestion: Secondary | ICD-10-CM | POA: Diagnosis not present

## 2019-11-12 LAB — SARS CORONAVIRUS 2 BY RT PCR (HOSPITAL ORDER, PERFORMED IN ~~LOC~~ HOSPITAL LAB): SARS Coronavirus 2: POSITIVE — AB

## 2019-11-12 MED ORDER — FLUTICASONE PROPIONATE 50 MCG/ACT NA SUSP: 2.0000 | Freq: Every day | NASAL | 0 refills | Status: AC

## 2019-11-12 MED ORDER — BENZONATATE 100 MG PO CAPS: 100.0000 mg | ORAL_CAPSULE | Freq: Three times a day (TID) | ORAL | 0 refills | Status: AC | PRN

## 2019-11-12 MED ORDER — ACETAMINOPHEN 500 MG PO TABS: 500.0000 mg | ORAL_TABLET | Freq: Four times a day (QID) | ORAL | 0 refills | Status: AC | PRN

## 2019-11-12 MED ORDER — ONDANSETRON HCL 4 MG PO TABS: 4.0000 mg | ORAL_TABLET | Freq: Three times a day (TID) | ORAL | 0 refills | Status: AC | PRN

## 2019-11-12 MED FILL — ACETAMINOPHEN EXTRA STRENGT: 500 | 25 days supply | Qty: 100 | Fill #0

## 2019-11-12 MED FILL — FLUTICASONE PROP 50 MCG SPR: 50 | 30 days supply | Qty: 16 | Fill #0

## 2019-11-12 MED FILL — ONDANSETRON HCL 4 MG TABLET: 4 | 3 days supply | Qty: 10 | Fill #0

## 2019-11-12 MED FILL — BENZONATATE 100 MG CAPS: 100 | 6 days supply | Qty: 20 | Fill #0

## 2019-11-12 NOTE — ED Provider Notes (Signed)
[facility] EMERGENCY DEPARTMENT Provider Note   CSN: [id number] Arrival date & time: 11/12/19  1413     History Chief Complaint  Patient presents with  . Covid sx    Stacy [name] is a 29 y.o. female with history of sickle cell anemia presenting for evaluation of acute onset, persistent cough, nasal congestion, loss of taste and smell for 2 days.  Has not had any fevers but has noted some chills and myalgias.  Denies shortness of breath, has had some central chest soreness associated with cough.  Has had nausea but no vomiting.  Has also had several episodes of watery nonbloody diarrhea.  She has had multiple Covid exposures at work with coworkers testing positive.  She is not currently vaccinated.  She smokes around 7 or 8 cigarettes daily.  She has tried Tylenol for her symptoms with a little relief.    The history is provided by the patient.       Past Medical History:  Diagnosis Date  . Sickle cell anemia [facility])     Patient Active Problem List   Diagnosis Date Noted  . Right upper lobe pneumonia   . HCAP (healthcare-associated pneumonia)   . Chest tightness   . Reticulocytosis   . Sickle cell crisis (HCC) 08/27/2017  . Central chest pain 08/27/2017    Past Surgical History:  Procedure Laterality Date  . CESAREAN SECTION       OB History    Gravida  1   Para      Term      Preterm      AB      Living        SAB      TAB      Ectopic      Multiple      Live Births              Family History  Problem Relation Age of Onset  . Stroke Other   . CAD Other   . Sickle cell anemia Other   . Diabetes Neg Hx     Social History   Tobacco Use  . Smoking status: Never Smoker  . Smokeless tobacco: Never Used  Vaping Use  . Vaping Use: Never used  Substance Use Topics  . Alcohol use: Yes    Comment: occ  . Drug use: No    Home Medications Prior to Admission medications   Medication Sig Start Date End Date Taking?  Authorizing Provider  acetaminophen (TYLENOL) 500 MG tablet Take 1 tablet (500 mg total) by mouth every 6 (six) hours as needed. 11/12/19   Camreigh Michie A, PA-C  benzonatate (TESSALON) 100 MG capsule Take 1 capsule (100 mg total) by mouth 3 (three) times daily as needed for cough. 11/12/19   Rickie Gutierres A, PA-C  fluticasone (FLONASE) 50 MCG/ACT nasal spray Place 2 sprays into both nostrils daily. 11/12/19   America Sandall A, PA-C  ondansetron (ZOFRAN ODT) 4 MG disintegrating tablet Take 1 tablet (4 mg total) by mouth every 8 (eight) hours as needed for up to 15 doses for nausea or vomiting. 02/26/19   Terald Sleeper, MD  ondansetron (ZOFRAN) 4 MG tablet Take 1 tablet (4 mg total) by mouth every 8 (eight) hours as needed for nausea or vomiting. 11/12/19   Michela Pitcher A, PA-C  oxyCODONE (ROXICODONE) 15 MG immediate release tablet Take 1 tablet (15 mg total) by mouth every 4 (four) hours as needed for pain.  09/02/17   Quentin Angst, MD    Allergies    Demerol [meperidine]  Review of Systems   Review of Systems  Constitutional: Positive for chills. Negative for fever.  HENT: Positive for congestion.   Respiratory: Positive for cough. Negative for shortness of breath.   Cardiovascular: Positive for chest pain.  Gastrointestinal: Positive for diarrhea and nausea. Negative for abdominal pain and vomiting.  All other systems reviewed and are negative.   Physical Exam Updated Vital Signs BP 113/80 (BP Location: Left Arm)   Pulse 83   Temp 98.6 F (37 C) (Oral)   Resp 18   LMP 10/23/2019   SpO2 100%   Physical Exam Vitals and nursing note reviewed.  Constitutional:      General: She is not in acute distress.    Appearance: She is well-developed.  HENT:     Head: Normocephalic and atraumatic.     Nose:     Comments: Sounds audibly congested    Mouth/Throat:     Comments: Tolerating secretions without difficulty Eyes:     General:        Right eye: No discharge.        Left eye: No  discharge.     Conjunctiva/sclera: Conjunctivae normal.  Neck:     Vascular: No JVD.     Trachea: No tracheal deviation.  Cardiovascular:     Rate and Rhythm: Normal rate and regular rhythm.  Pulmonary:     Effort: Pulmonary effort is normal.     Breath sounds: Normal breath sounds.     Comments: Speaking in full sentences without difficulty, SPO2 saturations 100% on room air Abdominal:     General: There is no distension.     Palpations: Abdomen is soft.     Tenderness: There is no abdominal tenderness. There is no guarding or rebound.  Musculoskeletal:     Cervical back: Neck supple.  Skin:    General: Skin is warm and dry.     Findings: No erythema.  Neurological:     Mental Status: She is alert.  Psychiatric:        Behavior: Behavior normal.     ED Results / Procedures / Treatments   Labs (all labs ordered are listed, but only abnormal results are displayed) Labs Reviewed  SARS CORONAVIRUS 2 BY RT PCR (HOSPITAL ORDER, PERFORMED IN [facility]) - Abnormal; Notable for the following components:      Result Value   SARS Coronavirus 2 POSITIVE (*)    All other components within normal limits    EKG None  Radiology No results found.  Procedures Procedures (including critical care time)  Medications Ordered in ED Medications - No data to display  ED Course  I have reviewed the triage vital signs and the nursing notes.  Pertinent labs & imaging results that were available during my care of the patient were reviewed by me and considered in my medical decision making (see chart for details).    MDM Rules/Calculators/A&P                          Stacy [name] was evaluated in Emergency Department on 11/12/2019 for the symptoms described in the history of present illness. She was evaluated in the context of the global COVID-19 pandemic, which necessitated consideration that the patient might be at risk for infection with the SARS-CoV-2 virus that  causes COVID-19. Institutional protocols and algorithms that pertain  to the evaluation of patients at risk for COVID-19 are in a state of rapid change based on information released by regulatory bodies including the CDC and federal and state organizations. These policies and algorithms were followed during the patient's care in the ED.  Patient with multiple Covid exposures presenting with Covid type symptoms for the last 2 days.  Her Covid test is positive here today unsurprisingly.  She is afebrile, vital signs are within normal limits.  Does not exhibit any evidence of respiratory distress and lungs are clear to auscultation bilaterally. Doubt PE. Discussed quarantining at home per current CDC guidelines as well as symptomatic management.  She was provided the information for the post Covid clinic at [facility] for follow-up.  Discussed strict ED return precautions. Patient verbalized understanding of and agreement with plan and is safe for discharge home at this time.     Final Clinical Impression(s) / ED Diagnoses Final diagnoses:  Suspected COVID-19 virus infection    Rx / DC Orders ED Discharge Orders         Ordered    benzonatate (TESSALON) 100 MG capsule  3 times daily PRN     Discontinue  Reprint     11/12/19 1543    fluticasone (FLONASE) 50 MCG/ACT nasal spray  Daily     Discontinue  Reprint     11/12/19 1543    ondansetron (ZOFRAN) 4 MG tablet  Every 8 hours PRN     Discontinue  Reprint     11/12/19 1543    acetaminophen (TYLENOL) 500 MG tablet  Every 6 hours PRN     Discontinue  Reprint     11/12/19 1543           Jeanie Sewer, PA-C 11/12/19 1546    Sabas Sous, MD 11/13/19 [phone]

## 2019-11-12 NOTE — Discharge Instructions (Addendum)
You can take Tylenol every 6 hours as needed for aches pains or fevers.  Do not exceed more than 4000 mg of Tylenol daily.  Take Tessalon as needed for cough.  Use fluticasone nasal spray for nasal congestion.  Use ondansetron as needed for nausea.  Drink plenty fluids and rest.  Can also use other over-the-counter medications such as Mucinex and DayQuil, etc. for symptom management.  You will need to quarantine at home for at least 10 days from symptom onset.  At this time if you have been fever free for at least 24 hours with the use of ibuprofen or Tylenol and your symptoms are improving then you can stop quarantining.  Follow-up at the post Covid care center at [facility] if your Covid test is positive.  All to schedule an appointment.  Return to the emergency department if any concerning signs or symptoms develop such as shortness of breath, persistent vomiting, low oxygen levels.

## 2019-11-12 NOTE — ED Triage Notes (Signed)
Pt c/o "covid symptoms" x 2 days-diarrhea, loss of taste, cough, URI sx-NAD-steady gait

## 2019-11-12 NOTE — ED Notes (Signed)
ED Provider at bedside. 

## 2019-11-13 ENCOUNTER — Telehealth: Payer: Self-pay | Admitting: Adult Health

## 2019-11-13 NOTE — Telephone Encounter (Signed)
Called and LMOM regarding monoclonal antibody treatment for COVID 19 given to those who are at risk for complications and/or hospitalization of the virus.  Patient meets criteria based on: sickle cell anemia  Call back number given: [phone]  My chart message: sent  Lillard Anes, NP

## 2020-04-11 ENCOUNTER — Encounter (HOSPITAL_BASED_OUTPATIENT_CLINIC_OR_DEPARTMENT_OTHER): Payer: Self-pay

## 2020-04-11 ENCOUNTER — Other Ambulatory Visit: Payer: Self-pay

## 2020-04-11 ENCOUNTER — Emergency Department (HOSPITAL_BASED_OUTPATIENT_CLINIC_OR_DEPARTMENT_OTHER)
Admission: EM | Admit: 2020-04-11 | Discharge: 2020-04-11 | Disposition: A | Discharge status: Home / Self Care | Payer: No Typology Code available for payment source | Attending: Emergency Medicine | Admitting: Emergency Medicine

## 2020-04-11 DIAGNOSIS — M791 Myalgia, unspecified site: Secondary | ICD-10-CM | POA: Insufficient documentation

## 2020-04-11 DIAGNOSIS — D571 Sickle-cell disease without crisis: Secondary | ICD-10-CM | POA: Diagnosis not present

## 2020-04-11 DIAGNOSIS — Z20822 Contact with and (suspected) exposure to covid-19: Secondary | ICD-10-CM | POA: Diagnosis not present

## 2020-04-11 DIAGNOSIS — R070 Pain in throat: Secondary | ICD-10-CM | POA: Insufficient documentation

## 2020-04-11 DIAGNOSIS — R519 Headache, unspecified: Secondary | ICD-10-CM | POA: Diagnosis present

## 2020-04-11 LAB — GROUP A STREP BY PCR: Group A Strep by PCR: NOT DETECTED

## 2020-04-11 LAB — SARS CORONAVIRUS 2 (TAT 6-24 HRS): SARS Coronavirus 2: NEGATIVE

## 2020-04-11 NOTE — ED Triage Notes (Signed)
Pt reports sore throat, headache and chills x2 days. 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 Pt states she has sickle cell.

## 2020-04-11 NOTE — Discharge Instructions (Addendum)
If you had any testing done today, the results will show up on your MyChart phone app in 24 hours.  Call your primary care doctor in the next 1-2 days to arrange video follow-up.   Use a finger pulse oximeter at home.  You may purchase one at [facility] or [facility] or online.  If the numbers drops and stays below 90%, return immediately back to the ER.  Otherwise increase your fluid intake, isolate at home for 5 days after symptoms resolve, and inform recent close contacts of the need to test for Covid.  

## 2020-04-11 NOTE — ED Provider Notes (Signed)
[facility] EMERGENCY DEPARTMENT Provider Note   CSN: [id number] Arrival date & time: 04/11/20  0840     History Chief Complaint  Patient presents with  . Covid Symptoms    Stacy [name] is a 30 y.o. female.  Presents with sickle cell disease presents with 2 days of headache body aches chills sore throat.  She is not vaccinated for COVID.  Denies fevers denies chest pain.  Denies any known sick contacts.        Past Medical History:  Diagnosis Date  . Sickle cell anemia [facility])     Patient Active Problem List   Diagnosis Date Noted  . Right upper lobe pneumonia   . HCAP (healthcare-associated pneumonia)   . Chest tightness   . Reticulocytosis   . Sickle cell crisis (HCC) 08/27/2017  . Central chest pain 08/27/2017    Past Surgical History:  Procedure Laterality Date  . CESAREAN SECTION    . CESAREAN SECTION       OB History    Gravida  1   Para      Term      Preterm      AB      Living        SAB      IAB      Ectopic      Multiple      Live Births              Family History  Problem Relation Age of Onset  . Stroke Other   . CAD Other   . Sickle cell anemia Other   . Diabetes Neg Hx     Social History   Tobacco Use  . Smoking status: Never Smoker  . Smokeless tobacco: Never Used  Vaping Use  . Vaping Use: Never used  Substance Use Topics  . Alcohol use: Yes    Comment: occ  . Drug use: No    Home Medications Prior to Admission medications   Medication Sig Start Date End Date Taking? Authorizing Provider  acetaminophen (TYLENOL) 500 MG tablet Take 1 tablet (500 mg total) by mouth every 6 (six) hours as needed. 11/12/19   Fawze, Mina A, PA-C  benzonatate (TESSALON) 100 MG capsule Take 1 capsule (100 mg total) by mouth 3 (three) times daily as needed for cough. 11/12/19   Fawze, Mina A, PA-C  fluticasone (FLONASE) 50 MCG/ACT nasal spray Place 2 sprays into both nostrils daily. 11/12/19   Fawze, Mina A, PA-C   ondansetron (ZOFRAN ODT) 4 MG disintegrating tablet Take 1 tablet (4 mg total) by mouth every 8 (eight) hours as needed for up to 15 doses for nausea or vomiting. 02/26/19   Terald Sleeper, MD  ondansetron (ZOFRAN) 4 MG tablet Take 1 tablet (4 mg total) by mouth every 8 (eight) hours as needed for nausea or vomiting. 11/12/19   Michela Pitcher A, PA-C  oxyCODONE (ROXICODONE) 15 MG immediate release tablet Take 1 tablet (15 mg total) by mouth every 4 (four) hours as needed for pain. 09/02/17   Quentin Angst, MD    Allergies    Demerol [meperidine]  Review of Systems   Review of Systems  Constitutional: Negative for fever.  HENT: Negative for ear pain.   Eyes: Negative for pain.  Respiratory: Positive for cough.   Cardiovascular: Negative for chest pain.  Gastrointestinal: Negative for abdominal pain.  Genitourinary: Negative for flank pain.  Musculoskeletal: Negative for back pain.  Skin: Negative  for rash.  Neurological: Negative for headaches.    Physical Exam Updated Vital Signs BP 97/63 (BP Location: Right Arm)   Pulse 72   Temp 98.3 F (36.8 C) (Oral)   Resp 18   Ht 5\' 1"  (1.549 m)   Wt 77.1 kg   SpO2 100%   BMI 32.12 kg/m   Physical Exam Constitutional:      General: She is not in acute distress.    Appearance: Normal appearance.  HENT:     Head: Normocephalic.     Nose: Nose normal.     Mouth/Throat:     Mouth: Mucous membranes are moist.     Pharynx: Oropharynx is clear. No oropharyngeal exudate or posterior oropharyngeal erythema.  Eyes:     Extraocular Movements: Extraocular movements intact.  Cardiovascular:     Rate and Rhythm: Normal rate.  Pulmonary:     Effort: Pulmonary effort is normal.  Musculoskeletal:        General: Normal range of motion.     Cervical back: Normal range of motion.  Neurological:     General: No focal deficit present.     Mental Status: She is alert. Mental status is at baseline.     ED Results / Procedures /  Treatments   Labs (all labs ordered are listed, but only abnormal results are displayed) Labs Reviewed  GROUP A STREP BY PCR  SARS CORONAVIRUS 2 (TAT 6-24 HRS)    EKG None  Radiology No results found.  Procedures Procedures (including critical care time)  Medications Ordered in ED Medications - No data to display  ED Course  I have reviewed the triage vital signs and the nursing notes.  Pertinent labs & imaging results that were available during my care of the patient were reviewed by me and considered in my medical decision making (see chart for details).    MDM Rules/Calculators/A&P                          COVID test sent and pending.  Recommending isolating at home while awaiting test results, finger pulse oximeter use at home, advised immediate return if the number is persistently below 90%, if she has worsening symptoms, trouble breathing or any additional concerns to return immediately back to the ER.  Otherwise advised follow-up with her primary care doctor in 2 or 3 days.  Final Clinical Impression(s) / ED Diagnoses Final diagnoses:  Suspected COVID-19 virus infection    Rx / DC Orders ED Discharge Orders    None       , MD 04/11/20 1144

## 2020-09-20 ENCOUNTER — Other Ambulatory Visit: Payer: Self-pay

## 2020-09-20 ENCOUNTER — Emergency Department (HOSPITAL_BASED_OUTPATIENT_CLINIC_OR_DEPARTMENT_OTHER)
Admission: EM | Admit: 2020-09-20 | Discharge: 2020-09-20 | Disposition: A | Discharge status: Home / Self Care | Payer: No Typology Code available for payment source | Attending: Emergency Medicine | Admitting: Emergency Medicine

## 2020-09-20 DIAGNOSIS — Z113 Encounter for screening for infections with a predominantly sexual mode of transmission: Secondary | ICD-10-CM | POA: Insufficient documentation

## 2020-09-20 LAB — URINALYSIS, ROUTINE W REFLEX MICROSCOPIC
Bilirubin Urine: NEGATIVE
Glucose, UA: NEGATIVE mg/dL
Ketones, ur: NEGATIVE mg/dL
Nitrite: NEGATIVE
Protein, ur: NEGATIVE mg/dL
Specific Gravity, Urine: 1.02 (ref 1.005–1.030)
pH: 6 (ref 5.0–8.0)

## 2020-09-20 LAB — PREGNANCY, URINE: Preg Test, Ur: NEGATIVE

## 2020-09-20 LAB — URINALYSIS, MICROSCOPIC (REFLEX)

## 2020-09-20 NOTE — ED Provider Notes (Signed)
[facility] EMERGENCY DEPARTMENT Provider Note   CSN: [id number] Arrival date & time: 09/20/20  1231     History Chief Complaint  Patient presents with   STD Check    Stacy [name] is a 30 y.o. female with no relevant past medical history who presents the ED because she was accompanying her friend who is here for an STI check.  She figured that she could also be checked for gonorrhea and chlamydia.  She denies any new sexual contacts.  She has not had any symptoms whatsoever.  However, since her friend is here being tested for STIs in context of penile discharge, she would also like to be tested.  She is adamant that she has not had any sexual course with the person with whom she arrived.  She adamantly denies any fevers, abdominal pain, nausea or vomiting, unusual vaginal discharge, vaginal bleeding, pain with intercourse, dysuria, increased urinary frequency, vaginal itching, or any other symptoms.  She states that she has a primary care provider with whom she can follow-up.  She is resting comfortably on her cell phone.  HPI     Past Medical History:  Diagnosis Date   Sickle cell anemia (HCC)     Patient Active Problem List   Diagnosis Date Noted   Right upper lobe pneumonia    HCAP (healthcare-associated pneumonia)    Chest tightness    Reticulocytosis    Sickle cell crisis (HCC) 08/27/2017   Central chest pain 08/27/2017    Past Surgical History:  Procedure Laterality Date   CESAREAN SECTION     CESAREAN SECTION       OB History     Gravida  1   Para      Term      Preterm      AB      Living         SAB      IAB      Ectopic      Multiple      Live Births              Family History  Problem Relation Age of Onset   Stroke Other    CAD Other    Sickle cell anemia Other    Diabetes Neg Hx     Social History   Tobacco Use   Smoking status: Never   Smokeless tobacco: Never  Vaping Use   Vaping Use: Never used   Substance Use Topics   Alcohol use: Yes    Comment: occ   Drug use: No    Home Medications Prior to Admission medications   Medication Sig Start Date End Date Taking? Authorizing Provider  acetaminophen (TYLENOL) 500 MG tablet Take 1 tablet (500 mg total) by mouth every 6 (six) hours as needed. 11/12/19   Fawze, Mina A, PA-C  benzonatate (TESSALON) 100 MG capsule Take 1 capsule (100 mg total) by mouth 3 (three) times daily as needed for cough. 11/12/19   Fawze, Mina A, PA-C  fluticasone (FLONASE) 50 MCG/ACT nasal spray Place 2 sprays into both nostrils daily. 11/12/19   Fawze, Mina A, PA-C  ondansetron (ZOFRAN ODT) 4 MG disintegrating tablet Take 1 tablet (4 mg total) by mouth every 8 (eight) hours as needed for up to 15 doses for nausea or vomiting. 02/26/19   Terald Sleeper, MD  ondansetron (ZOFRAN) 4 MG tablet Take 1 tablet (4 mg total) by mouth every 8 (eight) hours as needed for  nausea or vomiting. 11/12/19   Michela Pitcher A, PA-C  oxyCODONE (ROXICODONE) 15 MG immediate release tablet Take 1 tablet (15 mg total) by mouth every 4 (four) hours as needed for pain. 09/02/17   Quentin Angst, MD    Allergies    Demerol [meperidine]  Review of Systems   Review of Systems  All other systems reviewed and are negative.  Physical Exam Updated Vital Signs BP 114/80 (BP Location: Left Arm)   Pulse 90   Temp 98.5 F (36.9 C) (Oral)   Resp 18   Ht 5' (1.524 m)   Wt 71.7 kg   SpO2 100%   BMI 30.86 kg/m   Physical Exam Vitals and nursing note reviewed. Exam conducted with a chaperone present.  Constitutional:      General: She is not in acute distress.    Appearance: Normal appearance. She is not ill-appearing.  HENT:     Head: Normocephalic and atraumatic.  Eyes:     General: No scleral icterus.    Conjunctiva/sclera: Conjunctivae normal.  Pulmonary:     Effort: Pulmonary effort is normal.  Abdominal:     General: Abdomen is flat. There is no distension.     Palpations:  Abdomen is soft.     Tenderness: There is no abdominal tenderness. There is no guarding.     Comments: Soft, no areas of TTP.  Genitourinary:    Comments: Deferred. Skin:    General: Skin is dry.  Neurological:     Mental Status: She is alert and oriented to person, place, and time.     GCS: GCS eye subscore is 4. GCS verbal subscore is 5. GCS motor subscore is 6.  Psychiatric:        Mood and Affect: Mood normal.        Behavior: Behavior normal.        Thought Content: Thought content normal.    ED Results / Procedures / Treatments   Labs (all labs ordered are listed, but only abnormal results are displayed) Labs Reviewed  URINALYSIS, ROUTINE W REFLEX MICROSCOPIC - Abnormal; Notable for the following components:      Result Value   Hgb urine dipstick TRACE (*)    Leukocytes,Ua TRACE (*)    All other components within normal limits  URINALYSIS, MICROSCOPIC (REFLEX) - Abnormal; Notable for the following components:   Bacteria, UA FEW (*)    All other components within normal limits  PREGNANCY, URINE  GC/CHLAMYDIA PROBE AMP ([place]) NOT AT [facility]    EKG None  Radiology No results found.  Procedures Procedures   Medications Ordered in ED Medications - No data to display  ED Course  I have reviewed the triage vital signs and the nursing notes.  Pertinent labs & imaging results that were available during my care of the patient were reviewed by me and considered in my medical decision making (see chart for details).    MDM Rules/Calculators/A&P                          Stacy [name] was evaluated in Emergency Department on 09/20/2020 for the symptoms described in the history of present illness. She was evaluated in the context of the global COVID-19 pandemic, which necessitated consideration that the patient might be at risk for infection with the SARS-CoV-2 virus that causes COVID-19. Institutional protocols and algorithms that pertain to the evaluation of  patients at risk for COVID-19 are in  a state of rapid change based on information released by regulatory bodies including the CDC and federal and state organizations. These policies and algorithms were followed during the patient's care in the ED.  I personally reviewed patient's medical chart and all notes from triage and staff during today's encounter. I have also ordered and reviewed all labs and imaging that I felt to be medically necessary in the evaluation of this patient's complaints and with consideration of their physical exam. If needed, translation services were available and utilized.   Patient is asymptomatic and simply wanted to be tested for gonorrhea and chlamydia.  She is already provided urine.  We will send for [facility] testing.  Will not treat empirically given lack of any symptoms or concerning history.  She can follow-up with primary care provider regarding today's testing and for ongoing evaluation and treatment, if indicated.  She deferred GU exam given that she is asymptomatic.  She simply is here to support her friend.  Declined HIV and RPR testing.  Final Clinical Impression(s) / ED Diagnoses Final diagnoses:  Routine screening for STI (sexually transmitted infection)    Rx / DC Orders ED Discharge Orders     None        Lorelee New, PA-C 09/20/20 1414    Melene Plan, DO 09/20/20 1508

## 2020-09-20 NOTE — ED Triage Notes (Addendum)
Pt wants to be tested for STD. Pt denies symptoms or discharge & denies known interactions with partner with Std.

## 2020-09-20 NOTE — Discharge Instructions (Signed)
Your urine was sent for testing of gonorrhea and chlamydia.  You decided not to be tested for HIV and syphilis.  Given that you are without any symptoms whatsoever, no pelvic exam was performed.  Given lack of any concern for STI at this time, will hold off on empiric treatment.  Please follow-up with primary care provider for ongoing evaluation and management and regarding results of your testing.  You can also go to the [facility] in [place] for free asymptomatic STI testing moving forward.

## 2020-09-22 LAB — GC/CHLAMYDIA PROBE AMP (~~LOC~~) NOT AT ARMC
Chlamydia: NEGATIVE
Comment: NEGATIVE
Comment: NORMAL
Neisseria Gonorrhea: NEGATIVE

## 2021-01-08 ENCOUNTER — Other Ambulatory Visit: Payer: Self-pay

## 2021-01-08 ENCOUNTER — Emergency Department (HOSPITAL_BASED_OUTPATIENT_CLINIC_OR_DEPARTMENT_OTHER)
Admission: EM | Admit: 2021-01-08 | Discharge: 2021-01-08 | Disposition: A | Discharge status: Home / Self Care | Payer: No Typology Code available for payment source | Attending: Emergency Medicine | Admitting: Emergency Medicine

## 2021-01-08 ENCOUNTER — Encounter (HOSPITAL_BASED_OUTPATIENT_CLINIC_OR_DEPARTMENT_OTHER): Payer: Self-pay

## 2021-01-08 DIAGNOSIS — N898 Other specified noninflammatory disorders of vagina: Secondary | ICD-10-CM | POA: Diagnosis present

## 2021-01-08 DIAGNOSIS — B3731 Acute candidiasis of vulva and vagina: Secondary | ICD-10-CM | POA: Diagnosis not present

## 2021-01-08 DIAGNOSIS — N760 Acute vaginitis: Secondary | ICD-10-CM | POA: Insufficient documentation

## 2021-01-08 DIAGNOSIS — B9689 Other specified bacterial agents as the cause of diseases classified elsewhere: Secondary | ICD-10-CM | POA: Insufficient documentation

## 2021-01-08 LAB — URINALYSIS, MICROSCOPIC (REFLEX)

## 2021-01-08 LAB — URINALYSIS, ROUTINE W REFLEX MICROSCOPIC
Bilirubin Urine: NEGATIVE
Glucose, UA: NEGATIVE mg/dL
Hgb urine dipstick: NEGATIVE
Ketones, ur: NEGATIVE mg/dL
Nitrite: NEGATIVE
Protein, ur: NEGATIVE mg/dL
Specific Gravity, Urine: 1.02 (ref 1.005–1.030)
pH: 5.5 (ref 5.0–8.0)

## 2021-01-08 LAB — WET PREP, GENITAL
Sperm: NONE SEEN
Trich, Wet Prep: NONE SEEN

## 2021-01-08 LAB — HIV ANTIBODY (ROUTINE TESTING W REFLEX): HIV Screen 4th Generation wRfx: NONREACTIVE

## 2021-01-08 LAB — PREGNANCY, URINE: Preg Test, Ur: NEGATIVE

## 2021-01-08 MED ORDER — METRONIDAZOLE 500 MG PO TABS: 500.0000 mg | ORAL_TABLET | Freq: Two times a day (BID) | ORAL | 0 refills | Status: AC

## 2021-01-08 MED ORDER — FLUCONAZOLE 150 MG PO TABS: 150.0000 mg | ORAL_TABLET | Freq: Once | ORAL | 0 refills | Status: AC

## 2021-01-08 NOTE — ED Triage Notes (Signed)
Pt arrives with reports of vaginal discomfort X2 days states "it doesn't really hurt it just don't feel right". Pt denies any urinary symptoms, or vaginal discharge. NAD, VSS, ambulatory to room. Urine sample collected in triage. Pt denies any concern for pregnancy states that she uses Depo shot.

## 2021-01-08 NOTE — Discharge Instructions (Addendum)
You were evaluated in the emergency department for vaginal discomfort.  You tested positive for yeast and bacterial vaginosis.  You have other tests that are pending at time of discharge.  We will contact you if you need further treatment.  We are prescribing you 2 medications.  Please do not drink alcohol while taking them.  Follow-up with your primary care doctor or gynecology clinic.

## 2021-01-08 NOTE — ED Notes (Signed)
Discharge instructions discussed with pt. Pt verbalized understanding. Pt stable and ambulatory.  °

## 2021-01-08 NOTE — ED Provider Notes (Signed)
[facility] EMERGENCY DEPARTMENT Provider Note   CSN: [id number] Arrival date & time: 01/08/21  0827     History Chief Complaint  Patient presents with   SEXUALLY TRANSMITTED DISEASE    Stacy [name] is a 29 y.o. female.  She is here with a complaint of vaginal discomfort x2 days.  Wants to get tested for STDs.  Sexually active.  Using protection.  On Depo does not get periods.  No bleeding.  No fever.  The history is provided by the patient.  Female GU Problem This is a new problem. The current episode started 2 days ago. The problem occurs constantly. The problem has not changed since onset.Pertinent negatives include no chest pain, no abdominal pain, no headaches and no shortness of breath. Nothing aggravates the symptoms. Nothing relieves the symptoms. She has tried nothing for the symptoms. The treatment provided no relief.      Past Medical History:  Diagnosis Date   Sickle cell anemia [facility])     Patient Active Problem List   Diagnosis Date Noted   Right upper lobe pneumonia    HCAP (healthcare-associated pneumonia)    Chest tightness    Reticulocytosis    Sickle cell crisis (HCC) 08/27/2017   Central chest pain 08/27/2017    Past Surgical History:  Procedure Laterality Date   CESAREAN SECTION     CESAREAN SECTION       OB History     Gravida  1   Para      Term      Preterm      AB      Living         SAB      IAB      Ectopic      Multiple      Live Births              Family History  Problem Relation Age of Onset   Stroke Other    CAD Other    Sickle cell anemia Other    Diabetes Neg Hx     Social History   Tobacco Use   Smoking status: Never   Smokeless tobacco: Never  Vaping Use   Vaping Use: Never used  Substance Use Topics   Alcohol use: Yes    Comment: occ   Drug use: No    Home Medications Prior to Admission medications   Medication Sig Start Date End Date Taking? Authorizing Provider   acetaminophen (TYLENOL) 500 MG tablet Take 1 tablet (500 mg total) by mouth every 6 (six) hours as needed. 11/12/19   Fawze, Mina A, PA-C  benzonatate (TESSALON) 100 MG capsule Take 1 capsule (100 mg total) by mouth 3 (three) times daily as needed for cough. 11/12/19   Fawze, Mina A, PA-C  fluticasone (FLONASE) 50 MCG/ACT nasal spray Place 2 sprays into both nostrils daily. 11/12/19   Fawze, Mina A, PA-C  ondansetron (ZOFRAN ODT) 4 MG disintegrating tablet Take 1 tablet (4 mg total) by mouth every 8 (eight) hours as needed for up to 15 doses for nausea or vomiting. 02/26/19   Terald Sleeper, MD  ondansetron (ZOFRAN) 4 MG tablet Take 1 tablet (4 mg total) by mouth every 8 (eight) hours as needed for nausea or vomiting. 11/12/19   Michela Pitcher A, PA-C  oxyCODONE (ROXICODONE) 15 MG immediate release tablet Take 1 tablet (15 mg total) by mouth every 4 (four) hours as needed for pain. 09/02/17   Jegede,  Olugbemiga E, MD  potassium chloride SA (KLOR-CON) 20 MEQ tablet Take 20 mEq by mouth daily. 12/11/20   [provider]    Allergies    Demerol [meperidine]  Review of Systems   Review of Systems  Constitutional:  Negative for fever.  Respiratory:  Negative for shortness of breath.   Cardiovascular:  Negative for chest pain.  Gastrointestinal:  Negative for abdominal pain.  Genitourinary:  Positive for vaginal pain. Negative for dysuria, vaginal bleeding and vaginal discharge.  Neurological:  Negative for headaches.   Physical Exam Updated Vital Signs BP (!) 119/94 (BP Location: Right Arm)   Pulse 86   Temp 98.3 F (36.8 C) (Oral)   Resp 16   Ht 5' (1.524 m)   Wt 66.2 kg   SpO2 100%   BMI 28.51 kg/m   Physical Exam Vitals and nursing note reviewed.  Constitutional:      General: She is not in acute distress.    Appearance: She is well-developed.  HENT:     Head: Normocephalic and atraumatic.  Eyes:     Conjunctiva/sclera: Conjunctivae normal.  Cardiovascular:     Rate and  Rhythm: Normal rate and regular rhythm.     Heart sounds: No murmur heard. Pulmonary:     Effort: Pulmonary effort is normal. No respiratory distress.     Breath sounds: Normal breath sounds.  Abdominal:     Palpations: Abdomen is soft.     Tenderness: There is no abdominal tenderness.  Genitourinary:    Comments: Pelvic exam done with Melanie as chaperone.  Normal external genitalia.  Scant amount of thick white discharge in vault.  No cervical motion tenderness.  No adnexal masses or tenderness. Musculoskeletal:     Cervical back: Neck supple.  Skin:    General: Skin is warm and dry.  Neurological:     Mental Status: She is alert.    ED Results / Procedures / Treatments   Labs (all labs ordered are listed, but only abnormal results are displayed) Labs Reviewed  WET PREP, GENITAL - Abnormal; Notable for the following components:      Result Value   Yeast Wet Prep HPF POC PRESENT (*)    Clue Cells Wet Prep HPF POC PRESENT (*)    WBC, Wet Prep HPF POC MODERATE (*)    All other components within normal limits  URINALYSIS, ROUTINE W REFLEX MICROSCOPIC - Abnormal; Notable for the following components:   Leukocytes,Ua MODERATE (*)    All other components within normal limits  URINALYSIS, MICROSCOPIC (REFLEX) - Abnormal; Notable for the following components:   Bacteria, UA FEW (*)    All other components within normal limits  HIV ANTIBODY (ROUTINE TESTING W REFLEX)  PREGNANCY, URINE  RPR  GC/CHLAMYDIA PROBE AMP ([place]) NOT AT [facility]    EKG None  Radiology No results found.  Procedures Procedures   Medications Ordered in ED Medications - No data to display  ED Course  I have reviewed the triage vital signs and the nursing notes.  Pertinent labs & imaging results that were available during my care of the patient were reviewed by me and considered in my medical decision making (see chart for details).    MDM Rules/Calculators/A&P                           30 year old female sexually active here with vaginal discomfort.  Fairly benign exam other than some white discharge in vaginal  vault.  She tested positive for bacterial vaginosis and yeast.  Will cover with Diflucan and metronidazole.  Counseled to use barrier protection until the rest of her STD testing has resulted.  Return instructions discussed  Final Clinical Impression(s) / ED Diagnoses Final diagnoses:  Bacterial vaginosis  Vaginal candidiasis    Rx / DC Orders ED Discharge Orders          Ordered    metroNIDAZOLE (FLAGYL) 500 MG tablet  2 times daily        01/08/21 1009    fluconazole (DIFLUCAN) 150 MG tablet   Once        01/08/21 1009             Terrilee Files, MD 01/08/21 1745

## 2021-01-09 LAB — GC/CHLAMYDIA PROBE AMP (~~LOC~~) NOT AT ARMC
Chlamydia: NEGATIVE
Comment: NEGATIVE
Comment: NORMAL
Neisseria Gonorrhea: NEGATIVE

## 2021-01-09 LAB — RPR: RPR Ser Ql: NONREACTIVE

## 2021-06-07 ENCOUNTER — Emergency Department (HOSPITAL_BASED_OUTPATIENT_CLINIC_OR_DEPARTMENT_OTHER)
Admission: EM | Admit: 2021-06-07 | Discharge: 2021-06-08 | Disposition: A | Discharge status: Home / Self Care | Payer: No Typology Code available for payment source | Attending: Emergency Medicine | Admitting: Emergency Medicine

## 2021-06-07 ENCOUNTER — Other Ambulatory Visit: Payer: Self-pay

## 2021-06-07 ENCOUNTER — Encounter (HOSPITAL_BASED_OUTPATIENT_CLINIC_OR_DEPARTMENT_OTHER): Payer: Self-pay | Admitting: Emergency Medicine

## 2021-06-07 DIAGNOSIS — R000 Tachycardia, unspecified: Secondary | ICD-10-CM | POA: Diagnosis not present

## 2021-06-07 DIAGNOSIS — M79602 Pain in left arm: Secondary | ICD-10-CM | POA: Diagnosis present

## 2021-06-07 LAB — COMPREHENSIVE METABOLIC PANEL
ALT: 21 U/L (ref 0–44)
AST: 24 U/L (ref 15–41)
Albumin: 4.2 g/dL (ref 3.5–5.0)
Alkaline Phosphatase: 44 U/L (ref 38–126)
Anion gap: 7 (ref 5–15)
BUN: 12 mg/dL (ref 6–20)
CO2: 25 mmol/L (ref 22–32)
Calcium: 8.8 mg/dL — ABNORMAL LOW (ref 8.9–10.3)
Chloride: 108 mmol/L (ref 98–111)
Creatinine, Ser: 0.88 mg/dL (ref 0.44–1.00)
GFR, Estimated: >60 mL/min (ref >60)
Glucose, Bld: 103 mg/dL — ABNORMAL HIGH (ref 70–99)
Potassium: 3.3 mmol/L — ABNORMAL LOW (ref 3.5–5.1)
Sodium: 140 mmol/L (ref 135–145)
Total Bilirubin: 1 mg/dL (ref 0.3–1.2)
Total Protein: 7.2 g/dL (ref 6.5–8.1)

## 2021-06-07 LAB — CBC WITH DIFFERENTIAL/PLATELET
Abs Immature Granulocytes: 0.02 10*3/uL (ref 0.00–0.07)
Basophils Absolute: 0 10*3/uL (ref 0.0–0.1)
Basophils Relative: 1 %
Eosinophils Absolute: 0 10*3/uL (ref 0.0–0.5)
Eosinophils Relative: 1 %
HCT: 27.9 % — ABNORMAL LOW (ref 36.0–46.0)
Hemoglobin: 10.3 g/dL — ABNORMAL LOW (ref 12.0–15.0)
Immature Granulocytes: 0 %
Lymphocytes Relative: 38 %
Lymphs Abs: 2.4 10*3/uL (ref 0.7–4.0)
MCH: 27.7 pg (ref 26.0–34.0)
MCHC: 36.9 g/dL — ABNORMAL HIGH (ref 30.0–36.0)
MCV: 75 fL — ABNORMAL LOW (ref 80.0–100.0)
Monocytes Absolute: 0.4 10*3/uL (ref 0.1–1.0)
Monocytes Relative: 7 %
Neutro Abs: 3.4 10*3/uL (ref 1.7–7.7)
Neutrophils Relative %: 53 %
Platelets: 170 10*3/uL (ref 150–400)
RBC: 3.72 MIL/uL — ABNORMAL LOW (ref 3.87–5.11)
RDW: 15.4 % (ref 11.5–15.5)
WBC: 6.4 10*3/uL (ref 4.0–10.5)
nRBC: 0 % (ref 0.0–0.2)

## 2021-06-07 MED ORDER — HYDROMORPHONE HCL 1 MG/ML IJ SOLN
1.0000 mg | Freq: Once | INTRAMUSCULAR | Status: AC
  Administered 2021-06-07: 1 mg via INTRAVENOUS
  Filled 2021-06-07: qty 1

## 2021-06-07 MED ORDER — ONDANSETRON HCL 4 MG/2ML IJ SOLN
4.0000 mg | Freq: Once | INTRAMUSCULAR | Status: AC
  Administered 2021-06-07: 4 mg via INTRAVENOUS
  Filled 2021-06-07: qty 2

## 2021-06-07 MED ORDER — KETOROLAC TROMETHAMINE 15 MG/ML IJ SOLN
15.0000 mg | Freq: Once | INTRAMUSCULAR | Status: AC
  Administered 2021-06-07: 15 mg via INTRAVENOUS
  Filled 2021-06-07: qty 1

## 2021-06-07 NOTE — ED Provider Notes (Incomplete)
[facility] EMERGENCY DEPARTMENT Provider Note   CSN: [id number] Arrival date & time: 06/07/21  2151     History {Add pertinent medical, surgical, social history, OB history to HPI:1} Chief Complaint  Patient presents with   Arm Pain    Stacy [name] is a 31 y.o. female.  Patient is a 31 year old female with a history of sickle cell anemia with infrequent pain crises who is presenting today with severe pain in her left arm.  She reports yesterday she felt fine but when she woke up this morning she was having a deep ache in her left upper arm between the shoulder and the elbow.  She denies any trauma is unaware if she slept on it wrong.  She has had no numbness or tingling in her fingers.  She denies any fever.  No pain in the chest or shortness of breath.  She has not noticed any swelling of her arm.  She denies having frequent arm pain but reports it is just is very deep pain that is not feeling any better.  She tried taking Tylenol, ibuprofen and oxycodone at home without any improvement.  The history is provided by the patient.  Arm Pain      Home Medications Prior to Admission medications   Medication Sig Start Date End Date Taking? Authorizing Provider  acetaminophen (TYLENOL) 500 MG tablet Take 1 tablet (500 mg total) by mouth every 6 (six) hours as needed. 11/12/19   Fawze, Mina A, PA-C  benzonatate (TESSALON) 100 MG capsule Take 1 capsule (100 mg total) by mouth 3 (three) times daily as needed for cough. 11/12/19   Fawze, Mina A, PA-C  fluticasone (FLONASE) 50 MCG/ACT nasal spray Place 2 sprays into both nostrils daily. 11/12/19   Fawze, Mina A, PA-C  metroNIDAZOLE (FLAGYL) 500 MG tablet Take 1 tablet (500 mg total) by mouth 2 (two) times daily. 01/08/21   Terrilee Files, MD  ondansetron (ZOFRAN ODT) 4 MG disintegrating tablet Take 1 tablet (4 mg total) by mouth every 8 (eight) hours as needed for up to 15 doses for nausea or vomiting. 02/26/19   Terald Sleeper,  MD  ondansetron (ZOFRAN) 4 MG tablet Take 1 tablet (4 mg total) by mouth every 8 (eight) hours as needed for nausea or vomiting. 11/12/19   Michela Pitcher A, PA-C  oxyCODONE (ROXICODONE) 15 MG immediate release tablet Take 1 tablet (15 mg total) by mouth every 4 (four) hours as needed for pain. 09/02/17   Quentin Angst, MD  potassium chloride SA (KLOR-CON) 20 MEQ tablet Take 20 mEq by mouth daily. 12/11/20   [provider]      Allergies    Demerol [meperidine]    Review of Systems   Review of Systems  Physical Exam Updated Vital Signs BP 124/86 (BP Location: Right Arm)    Pulse (!) 112    Temp 98.2 F (36.8 C) (Oral)    Resp 18    Ht 5' (1.524 m)    Wt 68.9 kg    LMP 06/01/2021    SpO2 100%    BMI 29.69 kg/m  Physical Exam Vitals and nursing note reviewed.  Constitutional:      General: She is in acute distress.     Appearance: She is well-developed.     Comments: Tearful and appears very uncomfortable  HENT:     Head: Normocephalic and atraumatic.  Eyes:     Pupils: Pupils are equal, round, and reactive to light.  Cardiovascular:     Rate and Rhythm: Regular rhythm. Tachycardia present.     Heart sounds: Normal heart sounds. No murmur heard.   No friction rub.  Pulmonary:     Effort: Pulmonary effort is normal.     Breath sounds: Normal breath sounds. No wheezing or rales.  Abdominal:     General: Bowel sounds are normal. There is no distension.     Palpations: Abdomen is soft.     Tenderness: There is no abdominal tenderness. There is no guarding or rebound.  Musculoskeletal:        General: No tenderness. Normal range of motion.       Arms:     Cervical back: Normal range of motion and neck supple. No tenderness.     Comments: No edema.  2+ radial pulse noted in the left wrist, less than 2-second capillary refill of the fingers.  Hand is warm and normal sensation.  Lymphadenopathy:     Cervical: No cervical adenopathy.  Skin:    General: Skin is warm and  dry.     Findings: No rash.  Neurological:     Mental Status: She is alert and oriented to person, place, and time. Mental status is at baseline.     Cranial Nerves: No cranial nerve deficit.     Sensory: No sensory deficit.     Motor: No weakness.  Psychiatric:        Behavior: Behavior normal.    ED Results / Procedures / Treatments   Labs (all labs ordered are listed, but only abnormal results are displayed) Labs Reviewed  CBC WITH DIFFERENTIAL/PLATELET  COMPREHENSIVE METABOLIC PANEL  RETICULOCYTES    EKG None  Radiology No results found.  Procedures Procedures  {Document cardiac monitor, telemetry assessment procedure when appropriate:1}  Medications Ordered in ED Medications  HYDROmorphone (DILAUDID) injection 1 mg (has no administration in time range)  ondansetron (ZOFRAN) injection 4 mg (has no administration in time range)  ketorolac (TORADOL) 15 MG/ML injection 15 mg (has no administration in time range)    ED Course/ Medical Decision Making/ A&P                           Medical Decision Making Amount and/or Complexity of Data Reviewed Labs: ordered.  Risk Prescription drug management.   Patient presenting today with gradual worsening of her left arm pain.  Patient has no findings consistent with vascular abnormality.  She has palpable pulse, good capillary refill low suspicion for arterial injury.  Also low suspicion for DVT as patient has no swelling or pain up into her neck or chest.  Pain is mostly localized at the bottom of the bicep and around the elbow.  She denies any trauma.  Patient does not have frequent sickle cell attacks but reports that is not usually in her arm.  She did try her medications at home without improvement.  She denies any recent infectious symptoms.  She has no chest pain or shortness of breath.  I independently interpreted patient's EKG which shows no acute changes.  Her labs are pending.  She was given pain control.  External  medical records from her recent heme-onc visit were reviewed.  {Document critical care time when appropriate:1} {Document review of labs and clinical decision tools ie heart score, Chads2Vasc2 etc:1}  {Document your independent review of radiology images, and any outside records:1} {Document your discussion with family members, caretakers, and with consultants:1} {Document social determinants  of health affecting pt's care:1} {Document your decision making why or why not admission, treatments were needed:1} Final Clinical Impression(s) / ED Diagnoses Final diagnoses:  None    Rx / DC Orders ED Discharge Orders     None

## 2021-06-07 NOTE — ED Notes (Signed)
Pt. Reports pain in the L arm at the Elbow.  Pt. Reports she woke with this pain at 6am and it has gotten worse thru the day.  Pt. Is tearful and states it has been throbbing all day. ?

## 2021-06-07 NOTE — ED Notes (Signed)
Vital signs stable. 

## 2021-06-07 NOTE — ED Triage Notes (Addendum)
Reports aching pain to left elbow that started when she woke up this morning.  Took ibuprofen, tylenol, and oxycodone without relief.  Denies any injury.  Also reports Hx of sickle cell. ?

## 2021-06-08 DIAGNOSIS — M79602 Pain in left arm: Secondary | ICD-10-CM | POA: Diagnosis not present

## 2021-06-08 LAB — RETICULOCYTES
Immature Retic Fract: 29.1 % — ABNORMAL HIGH (ref 2.3–15.9)
RBC.: 3.68 MIL/uL — ABNORMAL LOW (ref 3.87–5.11)
Retic Count, Absolute: 125.1 10*3/uL (ref 19.0–186.0)
Retic Ct Pct: 3.4 % — ABNORMAL HIGH (ref 0.4–3.1)

## 2021-06-08 MED ORDER — HYDROMORPHONE HCL 1 MG/ML IJ SOLN
1.0000 mg | Freq: Once | INTRAMUSCULAR | Status: AC
  Administered 2021-06-08: 1 mg via INTRAVENOUS
  Filled 2021-06-08: qty 1

## 2021-06-08 NOTE — Discharge Instructions (Addendum)
Take ibuprofen 600 mg every 6 hours as needed for pain. ? ?Continue taking oxycodone as previously prescribed as needed for pain not relieved with ibuprofen. ? ?Rest. ? ?Follow-up with primary doctor if not improving in the next week, and return to the ER if symptoms significantly worsen or change. ?

## 2021-06-08 NOTE — ED Provider Notes (Signed)
?  Physical Exam  ?BP 110/71   Pulse (!) 108   Temp 98.2 ?F (36.8 ?C) (Oral)   Resp (!) 25   Ht 5' (1.524 m)   Wt 68.9 kg   LMP 06/01/2021   SpO2 100%   BMI 29.69 kg/m?  ? ?Physical Exam ?Vitals and nursing note reviewed.  ?Constitutional:   ?   General: She is not in acute distress. ?   Appearance: Normal appearance. She is not ill-appearing.  ?HENT:  ?   Head: Normocephalic and atraumatic.  ?Pulmonary:  ?   Effort: Pulmonary effort is normal.  ?Musculoskeletal:     ?   General: No swelling or tenderness. Normal range of motion.  ?Skin: ?   General: Skin is warm and dry.  ?Neurological:  ?   Mental Status: She is alert.  ? ? ?Procedures  ?Procedures ? ?ED Course / MDM  ?  ?Patient with history of sickle cell disease presenting with complaints of left arm pain.  This began in the absence of any injury or trauma and patient doubts this to be a sickle cell crisis.  She has no other symptoms. ? ?Care assumed from Dr. Anitra Lauth at shift change awaiting results of laboratory studies and response to medications.  Patient's laboratory studies have returned and are essentially unremarkable.  She is feeling better after receiving medication here in the ER and is now requesting discharge.  Patient will be discharged with follow-up as needed.  She has medication to take at home for pain. ? ? ? ? ?  ?Geoffery Lyons, MD ?06/08/21 0319 ? ?

## 2021-06-18 ENCOUNTER — Emergency Department (HOSPITAL_BASED_OUTPATIENT_CLINIC_OR_DEPARTMENT_OTHER)
Admission: EM | Admit: 2021-06-18 | Discharge: 2021-06-19 | Disposition: A | Discharge status: Home / Self Care | Payer: No Typology Code available for payment source | Attending: Emergency Medicine | Admitting: Emergency Medicine

## 2021-06-18 ENCOUNTER — Encounter (HOSPITAL_BASED_OUTPATIENT_CLINIC_OR_DEPARTMENT_OTHER): Payer: Self-pay

## 2021-06-18 ENCOUNTER — Other Ambulatory Visit: Payer: Self-pay

## 2021-06-18 DIAGNOSIS — J020 Streptococcal pharyngitis: Secondary | ICD-10-CM | POA: Insufficient documentation

## 2021-06-18 DIAGNOSIS — R000 Tachycardia, unspecified: Secondary | ICD-10-CM | POA: Diagnosis not present

## 2021-06-18 DIAGNOSIS — J029 Acute pharyngitis, unspecified: Secondary | ICD-10-CM | POA: Diagnosis present

## 2021-06-18 DIAGNOSIS — Z20822 Contact with and (suspected) exposure to covid-19: Secondary | ICD-10-CM | POA: Insufficient documentation

## 2021-06-18 LAB — GROUP A STREP BY PCR: Group A Strep by PCR: DETECTED — AB

## 2021-06-18 LAB — RESP PANEL BY RT-PCR (FLU A&B, COVID) ARPGX2
Influenza A by PCR: NEGATIVE
Influenza B by PCR: NEGATIVE
SARS Coronavirus 2 by RT PCR: NEGATIVE

## 2021-06-18 MED ORDER — AMOXICILLIN 500 MG PO CAPS: 500.0000 mg | ORAL_CAPSULE | Freq: Two times a day (BID) | ORAL | 0 refills | Status: AC

## 2021-06-18 MED ORDER — AMOXICILLIN 500 MG PO CAPS
500.0000 mg | ORAL_CAPSULE | Freq: Once | ORAL | Status: AC
  Administered 2021-06-18: 500 mg via ORAL
  Filled 2021-06-18: qty 1

## 2021-06-18 MED ORDER — ACETAMINOPHEN 325 MG PO TABS
650.0000 mg | ORAL_TABLET | Freq: Once | ORAL | Status: AC
  Administered 2021-06-18: 650 mg via ORAL
  Filled 2021-06-18: qty 2

## 2021-06-18 NOTE — Discharge Instructions (Signed)
You are seen in the ER today for your sore throat.  Of test positive for strep throat.  Been prescribed an antibiotic to take twice a day for the next 10 days.  Please take the prescribed entire course.  You may use Tylenol or Motrin for your fever as needed and return to the ER with any new severe symptoms. ?

## 2021-06-18 NOTE — ED Triage Notes (Signed)
Pt c/o sore throat, fever day 2-NAD-steady gait ?

## 2021-06-18 NOTE — ED Provider Notes (Signed)
?[facility] EMERGENCY DEPARTMENT ?Provider Note ? ? ?CSN: [id number] ?Arrival date & time: 06/18/21  2229 ? ?  ? ?History ? ?Chief Complaint  ?Patient presents with  ? Sore Throat  ? ? ?Stacy [name] is a 31 y.o. female with history of sickle cell disease who presents with concern for sore throat and fever for 2 days.  No other associated symptoms aside from mild headache.  No chest pain, cough, shortness of breath, or symptoms consistent with her sickle cell crises in the past. ? ?I personally read this patient's medical records.  In addition to the above listed she has history of C-section. ? ?HPI ? ?  ? ?Home Medications ?Prior to Admission medications   ?Medication Sig Start Date End Date Taking? Authorizing Provider  ?amoxicillin (AMOXIL) 500 MG capsule Take 1 capsule (500 mg total) by mouth 2 (two) times daily for 10 days. 06/18/21 06/28/21 Yes Chaniqua Brisby, Lupe Carney R, PA-C  ?acetaminophen (TYLENOL) 500 MG tablet Take 1 tablet (500 mg total) by mouth every 6 (six) hours as needed. 11/12/19   Fawze, Mina A, PA-C  ?benzonatate (TESSALON) 100 MG capsule Take 1 capsule (100 mg total) by mouth 3 (three) times daily as needed for cough. 11/12/19   Fawze, Mina A, PA-C  ?fluticasone (FLONASE) 50 MCG/ACT nasal spray Place 2 sprays into both nostrils daily. 11/12/19   Fawze, Mina A, PA-C  ?metroNIDAZOLE (FLAGYL) 500 MG tablet Take 1 tablet (500 mg total) by mouth 2 (two) times daily. 01/08/21   Terrilee Files, MD  ?ondansetron (ZOFRAN ODT) 4 MG disintegrating tablet Take 1 tablet (4 mg total) by mouth every 8 (eight) hours as needed for up to 15 doses for nausea or vomiting. 02/26/19   Terald Sleeper, MD  ?ondansetron (ZOFRAN) 4 MG tablet Take 1 tablet (4 mg total) by mouth every 8 (eight) hours as needed for nausea or vomiting. 11/12/19   Michela Pitcher A, PA-C  ?oxyCODONE (ROXICODONE) 15 MG immediate release tablet Take 1 tablet (15 mg total) by mouth every 4 (four) hours as needed for pain. 09/02/17   Quentin Angst, MD  ?potassium chloride SA (KLOR-CON) 20 MEQ tablet Take 20 mEq by mouth daily. 12/11/20   [provider]  ?   ? ?Allergies    ?Demerol [meperidine]   ? ?Review of Systems   ?Review of Systems  ?Constitutional:  Positive for activity change, appetite change, fatigue and fever. Negative for chills and unexpected weight change.  ?HENT:  Positive for sore throat. Negative for voice change.   ?Respiratory: Negative.    ?Cardiovascular: Negative.   ?Gastrointestinal: Negative.   ?Genitourinary: Negative.   ?Musculoskeletal: Negative.   ?Neurological: Negative.   ?Hematological: Negative.   ? ?Physical Exam ?Updated Vital Signs ?BP 124/75 (BP Location: Left Arm)   Pulse (!) 108   Temp (!) 101.5 ?F (38.6 ?C) (Oral)   Resp 20   Ht 5' (1.524 m)   Wt 69.9 kg   LMP 06/05/2021   SpO2 100%   BMI 30.08 kg/m?  ?Physical Exam ?Vitals and nursing note reviewed.  ?Constitutional:   ?   Appearance: She is obese. She is not ill-appearing or toxic-appearing.  ?HENT:  ?   Head: Normocephalic and atraumatic.  ?   Nose: Nose normal.  ?   Mouth/Throat:  ?   Mouth: Mucous membranes are moist.  ?   Pharynx: Oropharynx is clear. Posterior oropharyngeal erythema present. No oropharyngeal exudate.  ?   Tonsils: Tonsillar exudate  present. 2+ on the right. 2+ on the left.  ?Eyes:  ?   General: Lids are normal. Vision grossly intact.     ?   Right eye: No discharge.     ?   Left eye: No discharge.  ?   Conjunctiva/sclera: Conjunctivae normal.  ?   Pupils: Pupils are equal, round, and reactive to light.  ?Neck:  ?   Trachea: Trachea and phonation normal.  ?   Meningeal: Brudzinski's sign and Kernig's sign absent.  ?Cardiovascular:  ?   Rate and Rhythm: Normal rate and regular rhythm.  ?   Pulses: Normal pulses.  ?   Heart sounds: Normal heart sounds. No murmur heard. ?Pulmonary:  ?   Effort: Pulmonary effort is normal. No tachypnea, bradypnea, accessory muscle usage, prolonged expiration or respiratory distress.  ?    Breath sounds: Normal breath sounds. No wheezing or rales.  ?Chest:  ?   Chest wall: No mass, lacerations, deformity, swelling, tenderness, crepitus or edema.  ?Abdominal:  ?   General: Bowel sounds are normal. There is no distension.  ?   Palpations: Abdomen is soft.  ?   Tenderness: There is no abdominal tenderness. There is no right CVA tenderness, left CVA tenderness, guarding or rebound.  ?Musculoskeletal:     ?   General: No deformity.  ?   Cervical back: Normal range of motion and neck supple.  ?Lymphadenopathy:  ?   Cervical: Cervical adenopathy present.  ?   Right cervical: Superficial cervical adenopathy present.  ?   Left cervical: No superficial cervical adenopathy.  ?Skin: ?   General: Skin is warm and dry.  ?   Capillary Refill: Capillary refill takes less than 2 seconds.  ?Neurological:  ?   General: No focal deficit present.  ?   Mental Status: She is alert and oriented to person, place, and time. Mental status is at baseline.  ?   GCS: GCS eye subscore is 4. GCS verbal subscore is 5. GCS motor subscore is 6.  ?   Gait: Gait is intact.  ?Psychiatric:     ?   Mood and Affect: Mood normal.  ? ? ?ED Results / Procedures / Treatments   ?Labs ?(all labs ordered are listed, but only abnormal results are displayed) ?Labs Reviewed  ?GROUP A STREP BY PCR - Abnormal; Notable for the following components:  ?    Result Value  ? Group A Strep by PCR DETECTED (*)   ? All other components within normal limits  ?RESP PANEL BY RT-PCR (FLU A&B, COVID) ARPGX2  ? ? ?EKG ?None ? ?Radiology ?No results found. ? ?Procedures ?Procedures  ? ? ?Medications Ordered in ED ?Medications  ?acetaminophen (TYLENOL) tablet 650 mg (650 mg Oral Given 06/18/21 2323)  ?amoxicillin (AMOXIL) capsule 500 mg (500 mg Oral Given 06/18/21 2358)  ? ? ?ED Course/ Medical Decision Making/ A&P ?  ?                        ?Medical Decision Making ?31 year old female presents with concern for sore throat for 2 days.   ? ?Febrile on intake and mildly  tachycardic.  HEENT exam concerning for erythema, edema, and exudate on tonsils bilaterally with posterior pharyngeal erythema as well.  Cardiopulmonary and abdominal exams are benign.  Patient is requesting intact all 4 extremities.  Appears uncomfortable but overall well-hydrated. ? ?Amount and/or Complexity of Data Reviewed ?Labs:  ?   Details: RVP negative. strep  positive ? ?Risk ?OTC drugs. ?Prescription drug management. ? ? ?Patient reevaluated and feeling improved after her Tylenol.  First dose of antibiotic is administered in the emergency department for her strep infection.  Given overall well appearance and reassuring vital signs as well as lack of chest pain or respiratory symptoms and the sickle cell patient do not feel any further work-up is warranted in the ER at this time.  Physical exam and HPI most consistent with strep pharyngitis. ? ?Lorenza voiced understanding of her medical evaluation and treatment plan.  Each of her questions answered to her expressed satisfaction.  Return precautions given.  Patient is well-appearing, stable, and was discharged in good condition. ? ?This chart was dictated using voice recognition software, Dragon. Despite the best efforts of this provider to proofread and correct errors, errors may still occur which can change documentation meaning. ? ?Final Clinical Impression(s) / ED Diagnoses ?Final diagnoses:  ?Strep pharyngitis  ? ? ?Rx / DC Orders ?ED Discharge Orders   ? ?      Ordered  ?  amoxicillin (AMOXIL) 500 MG capsule  2 times daily       ? 06/18/21 2353  ? ?  ?  ? ?  ? ? ?  ?Paris Lore, PA-C ?06/19/21 0007 ? ?  ?Terrilee Files, MD ?06/19/21 1157 ? ?

## 2021-06-19 NOTE — ED Notes (Signed)
Patient discharged to home.  All discharge instructions reviewed.  Patient verbalized understanding via teachback method.  VS WDL.  Respirations even and unlabored.  Ambulatory out of ED.   °

## 2023-05-01 ENCOUNTER — Emergency Department (HOSPITAL_BASED_OUTPATIENT_CLINIC_OR_DEPARTMENT_OTHER): Payer: Medicaid Other

## 2023-05-01 ENCOUNTER — Encounter (HOSPITAL_BASED_OUTPATIENT_CLINIC_OR_DEPARTMENT_OTHER): Payer: Self-pay | Admitting: Emergency Medicine

## 2023-05-01 ENCOUNTER — Emergency Department (HOSPITAL_BASED_OUTPATIENT_CLINIC_OR_DEPARTMENT_OTHER)
Admission: EM | Admit: 2023-05-01 | Discharge: 2023-05-01 | Disposition: A | Discharge status: Home / Self Care | Payer: Medicaid Other | Attending: Emergency Medicine | Admitting: Emergency Medicine

## 2023-05-01 ENCOUNTER — Other Ambulatory Visit: Payer: Self-pay

## 2023-05-01 DIAGNOSIS — Z79899 Other long term (current) drug therapy: Secondary | ICD-10-CM | POA: Diagnosis not present

## 2023-05-01 DIAGNOSIS — Z20822 Contact with and (suspected) exposure to covid-19: Secondary | ICD-10-CM | POA: Diagnosis not present

## 2023-05-01 DIAGNOSIS — J101 Influenza due to other identified influenza virus with other respiratory manifestations: Secondary | ICD-10-CM | POA: Insufficient documentation

## 2023-05-01 LAB — BASIC METABOLIC PANEL
Anion gap: 9 (ref 5–15)
BUN: 11 mg/dL (ref 6–20)
CO2: 21 mmol/L — ABNORMAL LOW (ref 22–32)
Calcium: 9 mg/dL (ref 8.9–10.3)
Chloride: 108 mmol/L (ref 98–111)
Creatinine, Ser: 0.7 mg/dL (ref 0.44–1.00)
GFR, Estimated: >60 mL/min (ref >60)
Glucose, Bld: 82 mg/dL (ref 70–99)
Potassium: 3.5 mmol/L (ref 3.5–5.1)
Sodium: 138 mmol/L (ref 135–145)

## 2023-05-01 LAB — CBC WITH DIFFERENTIAL/PLATELET
Abs Immature Granulocytes: 0.01 10*3/uL (ref 0.00–0.07)
Basophils Absolute: 0 10*3/uL (ref 0.0–0.1)
Basophils Relative: 1 %
Eosinophils Absolute: 0 10*3/uL (ref 0.0–0.5)
Eosinophils Relative: 0 %
HCT: 29.6 % — ABNORMAL LOW (ref 36.0–46.0)
Hemoglobin: 11 g/dL — ABNORMAL LOW (ref 12.0–15.0)
Immature Granulocytes: 0 %
Lymphocytes Relative: 16 %
Lymphs Abs: 0.9 10*3/uL (ref 0.7–4.0)
MCH: 27.6 pg (ref 26.0–34.0)
MCHC: 37.2 g/dL — ABNORMAL HIGH (ref 30.0–36.0)
MCV: 74.4 fL — ABNORMAL LOW (ref 80.0–100.0)
Monocytes Absolute: 0.5 10*3/uL (ref 0.1–1.0)
Monocytes Relative: 9 %
Neutro Abs: 4.2 10*3/uL (ref 1.7–7.7)
Neutrophils Relative %: 74 %
Platelets: 142 10*3/uL — ABNORMAL LOW (ref 150–400)
RBC: 3.98 MIL/uL (ref 3.87–5.11)
RDW: 14.6 % (ref 11.5–15.5)
WBC: 5.6 10*3/uL (ref 4.0–10.5)
nRBC: 0 % (ref 0.0–0.2)

## 2023-05-01 LAB — RESP PANEL BY RT-PCR (RSV, FLU A&B, COVID)  RVPGX2
Influenza A by PCR: POSITIVE — AB
Influenza B by PCR: NEGATIVE
Resp Syncytial Virus by PCR: NEGATIVE
SARS Coronavirus 2 by RT PCR: NEGATIVE

## 2023-05-01 MED ORDER — DIPHENHYDRAMINE HCL 25 MG PO CAPS: 50.0000 mg | ORAL_CAPSULE | Freq: Once | ORAL | Status: DC

## 2023-05-01 MED ORDER — KETOROLAC TROMETHAMINE 15 MG/ML IJ SOLN
15.0000 mg | Freq: Once | INTRAMUSCULAR | Status: AC
  Administered 2023-05-01: 15 mg via INTRAVENOUS
  Filled 2023-05-01: qty 1

## 2023-05-01 MED ORDER — OXYCODONE-ACETAMINOPHEN 5-325 MG PO TABS
1.0000 | ORAL_TABLET | ORAL | Status: DC | PRN
  Administered 2023-05-01: 1 via ORAL
  Filled 2023-05-01: qty 1

## 2023-05-01 MED ORDER — OSELTAMIVIR PHOSPHATE 75 MG PO CAPS
75.0000 mg | ORAL_CAPSULE | Freq: Once | ORAL | Status: AC
Start: 2023-05-01 — End: 2023-05-01
  Administered 2023-05-01: 75 mg via ORAL
  Filled 2023-05-01: qty 1

## 2023-05-01 MED ORDER — MORPHINE SULFATE (PF) 4 MG/ML IV SOLN: 4.0000 mg | Freq: Once | INTRAVENOUS | Status: DC

## 2023-05-01 MED ORDER — SODIUM CHLORIDE 0.9 % IV BOLUS
1000.0000 mL | Freq: Once | INTRAVENOUS | Status: AC
  Administered 2023-05-01: 1000 mL via INTRAVENOUS

## 2023-05-01 MED ORDER — OSELTAMIVIR PHOSPHATE 75 MG PO CAPS: 75.0000 mg | ORAL_CAPSULE | Freq: Two times a day (BID) | ORAL | 0 refills | Status: AC

## 2023-05-01 NOTE — ED Triage Notes (Signed)
Fever/body aches (100.6)

## 2023-05-01 NOTE — Discharge Instructions (Signed)
You can take Tamiflu every 12 hours until you get your prescription runs out.  You can take Motrin and Tylenol at home.  Return to the emergency department for difficulty breathing, or pain in your chest.

## 2023-05-01 NOTE — ED Provider Notes (Signed)
[place] EMERGENCY DEPARTMENT AT [facility] Provider Note  CSN: [id number] Arrival date & time: 05/01/23 7829  Chief Complaint(s) No chief complaint on file.  HPI Stacy [name] is a 33 y.o. female here today for myalgias, cough.  Patient has a history of sickle cell disease.  Patient overall does pretty well with her sickle cell disease, has not required any recent hospitalizations.  She has not felt short of breath.  Patient does not take any narcotics at home.   Past Medical History Past Medical History:  Diagnosis Date   Sickle cell anemia [facility])    Patient Active Problem List   Diagnosis Date Noted   Right upper lobe pneumonia    HCAP (healthcare-associated pneumonia)    Chest tightness    Reticulocytosis    Sickle cell crisis (HCC) 08/27/2017   Central chest pain 08/27/2017   Home Medication(s) Prior to Admission medications   Medication Sig Start Date End Date Taking? Authorizing Provider  oseltamivir (TAMIFLU) 75 MG capsule Take 1 capsule (75 mg total) by mouth every 12 (twelve) hours. 05/01/23  Yes Anders Simmonds T, DO  acetaminophen (TYLENOL) 500 MG tablet Take 1 tablet (500 mg total) by mouth every 6 (six) hours as needed. 11/12/19   Fawze, Mina A, PA-C  benzonatate (TESSALON) 100 MG capsule Take 1 capsule (100 mg total) by mouth 3 (three) times daily as needed for cough. 11/12/19   Fawze, Mina A, PA-C  fluticasone (FLONASE) 50 MCG/ACT nasal spray Place 2 sprays into both nostrils daily. 11/12/19   Fawze, Mina A, PA-C  metroNIDAZOLE (FLAGYL) 500 MG tablet Take 1 tablet (500 mg total) by mouth 2 (two) times daily. 01/08/21   Terrilee Files, MD  ondansetron (ZOFRAN ODT) 4 MG disintegrating tablet Take 1 tablet (4 mg total) by mouth every 8 (eight) hours as needed for up to 15 doses for nausea or vomiting. 02/26/19   Terald Sleeper, MD  ondansetron (ZOFRAN) 4 MG tablet Take 1 tablet (4 mg total) by mouth every 8 (eight) hours as needed for nausea or vomiting.  11/12/19   Michela Pitcher A, PA-C  oxyCODONE (ROXICODONE) 15 MG immediate release tablet Take 1 tablet (15 mg total) by mouth every 4 (four) hours as needed for pain. 09/02/17   Quentin Angst, MD  potassium chloride SA (KLOR-CON) 20 MEQ tablet Take 20 mEq by mouth daily. 12/11/20   [provider]                                                                                                                                    Past Surgical History Past Surgical History:  Procedure Laterality Date   CESAREAN SECTION     CESAREAN SECTION     Family History Family History  Problem Relation Age of Onset   Stroke Other    CAD Other    Sickle cell anemia Other    Diabetes  Neg Hx     Social History Social History   Tobacco Use   Smoking status: Never   Smokeless tobacco: Never  Vaping Use   Vaping status: Never Used  Substance Use Topics   Alcohol use: Yes    Comment: occ   Drug use: No   Allergies Demerol [meperidine]  Review of Systems Review of Systems  Physical Exam Vital Signs  I have reviewed the triage vital signs BP 112/82   Pulse (!) 108   Temp 99.8 F (37.7 C) (Oral)   Resp 20   Wt 66.7 kg   SpO2 100%   BMI 28.71 kg/m   Physical Exam Vitals reviewed.  HENT:     Nose: Nose normal.  Eyes:     Pupils: Pupils are equal, round, and reactive to light.  Cardiovascular:     Rate and Rhythm: Tachycardia present.     Pulses: Normal pulses.  Pulmonary:     Effort: Pulmonary effort is normal. No respiratory distress.     Breath sounds: No stridor. No wheezing, rhonchi or rales.  Musculoskeletal:     Cervical back: Normal range of motion.  Skin:    General: Skin is warm.  Neurological:     Mental Status: She is alert.     ED Results and Treatments Labs (all labs ordered are listed, but only abnormal results are displayed) Labs Reviewed  RESP PANEL BY RT-PCR (RSV, FLU A&B, COVID)  RVPGX2 - Abnormal; Notable for the following components:       Result Value   Influenza A by PCR POSITIVE (*)    All other components within normal limits  CBC WITH DIFFERENTIAL/PLATELET - Abnormal; Notable for the following components:   Hemoglobin 11.0 (*)    HCT 29.6 (*)    MCV 74.4 (*)    MCHC 37.2 (*)    Platelets 142 (*)    All other components within normal limits  BASIC METABOLIC PANEL - Abnormal; Notable for the following components:   CO2 21 (*)    All other components within normal limits                                                                                                                          Radiology DG Chest 1 View Result Date: 05/01/2023 CLINICAL DATA:  Cough.  Flu a positive. EXAM: CHEST  1 VIEW COMPARISON:  09/02/2017 FINDINGS: Heart size and pulmonary vascularity are normal. Lungs are clear. No pleural effusion or pneumothorax. Mediastinal contours appear intact. IMPRESSION: No active disease. Electronically Signed   By: Burman Nieves M.D.   On: 05/01/2023 22:34    Pertinent labs & imaging results that were available during my care of the patient were reviewed by me and considered in my medical decision making (see MDM for details).  Medications Ordered in ED Medications  oxyCODONE-acetaminophen (PERCOCET/ROXICET) 5-325 MG per tablet 1 tablet (1 tablet Oral Given 05/01/23 1905)  sodium chloride 0.9 % bolus 1,000 mL (  1,000 mLs Intravenous New Bag/Given 05/01/23 2205)  ketorolac (TORADOL) 15 MG/ML injection 15 mg (15 mg Intravenous Given 05/01/23 2207)  oseltamivir (TAMIFLU) capsule 75 mg (75 mg Oral Given 05/01/23 2205)                                                                                                                                     Procedures Procedures  (including critical care time)  Medical Decision Making / ED Course   This patient presents to the ED for concern of myalgias, URI symptoms, this involves an extensive number of treatment options, and is a complaint that carries with it a high  risk of complications and morbidity.  The differential diagnosis includes viral syndrome, influenza, consider acute chest.  MDM: 33 year old female with sickle cell here today for myalgias, cough.  Patient did test positive for influenza.  With her history of sickle cell disease, will obtain labs, provide some IV fluids.  Overall, her sickle cell seems to be pretty well-managed given her lack of frequent hospitalizations.  Will obtain a chest x-ray, patient without any shortness of breath.  She does have a cough.  Chest x-ray will help in evaluation of acute chest although I think this is less likely looking at the patient.  Patient with significant risk factors, she will provide her with Tamiflu.  Reassessment 11:05 PM-patient feeling a bit better.  Labs, and imaging not consistent with acute chest.  Discharged with a prescription for Tamiflu.  Patient agreeable with this plan.   Additional history obtained: -Additional history obtained from family in room -External records from outside source obtained and reviewed including: Chart review including previous notes, labs, imaging, consultation notes   Lab Tests: -I ordered, reviewed, and interpreted labs.   The pertinent results include:   Labs Reviewed  RESP PANEL BY RT-PCR (RSV, FLU A&B, COVID)  RVPGX2 - Abnormal; Notable for the following components:      Result Value   Influenza A by PCR POSITIVE (*)    All other components within normal limits  CBC WITH DIFFERENTIAL/PLATELET - Abnormal; Notable for the following components:   Hemoglobin 11.0 (*)    HCT 29.6 (*)    MCV 74.4 (*)    MCHC 37.2 (*)    Platelets 142 (*)    All other components within normal limits  BASIC METABOLIC PANEL - Abnormal; Notable for the following components:   CO2 21 (*)    All other components within normal limits      Imaging Studies ordered: I ordered imaging studies including chest x-ray I independently visualized and interpreted imaging. I agree  with the radiologist interpretation   Medicines ordered and prescription drug management: Meds ordered this encounter  Medications   oxyCODONE-acetaminophen (PERCOCET/ROXICET) 5-325 MG per tablet 1 tablet    Refill:  0   sodium chloride 0.9 % bolus 1,000 mL   DISCONTD: morphine (PF) 4 MG/ML injection  4 mg   DISCONTD: diphenhydrAMINE (BENADRYL) capsule 50 mg   ketorolac (TORADOL) 15 MG/ML injection 15 mg   oseltamivir (TAMIFLU) capsule 75 mg   oseltamivir (TAMIFLU) 75 MG capsule    Sig: Take 1 capsule (75 mg total) by mouth every 12 (twelve) hours.    Dispense:  10 capsule    Refill:  0    -I have reviewed the patients home medicines and have made adjustments as needed   Cardiac Monitoring: The patient was maintained on a cardiac monitor.  I personally viewed and interpreted the cardiac monitored which showed an underlying rhythm of: Sinus rhythm  Social Determinants of Health:  Factors impacting patients care include: Multiple medical comorbidities including sickle cell disease   Reevaluation: After the interventions noted above, I reevaluated the patient and found that they have :improved  Co morbidities that complicate the patient evaluation  Past Medical History:  Diagnosis Date   Sickle cell anemia (HCC)       Dispostion: I considered admission for this patient, however patient is appropriate for outpatient management.     Final Clinical Impression(s) / ED Diagnoses Final diagnoses:  Influenza A     @PCDICTATION @    Anders Simmonds T, DO 05/01/23 2309

## 2023-07-15 NOTE — Progress Notes (Signed)
 Depo-Provera 150mL given IM R deltoid, no complications.    CE: 11/2023 - [facility] Next depo: 09/30/2023 - 10/14/2023.  [place]berry DELENA Sprung, CMA

## 2023-10-01 ENCOUNTER — Other Ambulatory Visit: Payer: Self-pay | Admitting: Internal Medicine

## 2023-10-01 ENCOUNTER — Emergency Department (HOSPITAL_BASED_OUTPATIENT_CLINIC_OR_DEPARTMENT_OTHER)

## 2023-10-01 ENCOUNTER — Encounter (HOSPITAL_BASED_OUTPATIENT_CLINIC_OR_DEPARTMENT_OTHER): Payer: Self-pay | Admitting: Urology

## 2023-10-01 ENCOUNTER — Other Ambulatory Visit: Payer: Self-pay

## 2023-10-01 ENCOUNTER — Inpatient Hospital Stay (HOSPITAL_BASED_OUTPATIENT_CLINIC_OR_DEPARTMENT_OTHER)
Admission: EM | Admit: 2023-10-01 | Discharge: 2023-10-03 | DRG: 812 | Disposition: A | Discharge status: Home / Self Care | Attending: Internal Medicine | Admitting: Internal Medicine

## 2023-10-01 DIAGNOSIS — D5700 Hb-SS disease with crisis, unspecified: Principal | ICD-10-CM | POA: Diagnosis present

## 2023-10-01 DIAGNOSIS — Z6831 Body mass index (BMI) 31.0-31.9, adult: Secondary | ICD-10-CM

## 2023-10-01 DIAGNOSIS — G894 Chronic pain syndrome: Secondary | ICD-10-CM | POA: Diagnosis present

## 2023-10-01 DIAGNOSIS — E669 Obesity, unspecified: Secondary | ICD-10-CM | POA: Diagnosis present

## 2023-10-01 DIAGNOSIS — D72829 Elevated white blood cell count, unspecified: Secondary | ICD-10-CM | POA: Diagnosis present

## 2023-10-01 DIAGNOSIS — D638 Anemia in other chronic diseases classified elsewhere: Secondary | ICD-10-CM | POA: Diagnosis present

## 2023-10-01 DIAGNOSIS — Z885 Allergy status to narcotic agent status: Secondary | ICD-10-CM | POA: Diagnosis not present

## 2023-10-01 HISTORY — DX: Sickle-cell disease without crisis: D57.1

## 2023-10-01 LAB — RETICULOCYTES
Immature Retic Fract: 25.1 % — ABNORMAL HIGH (ref 2.3–15.9)
RBC.: 3.64 MIL/uL — ABNORMAL LOW (ref 3.87–5.11)
Retic Count, Absolute: 128.5 K/uL (ref 19.0–186.0)
Retic Ct Pct: 3.5 % — ABNORMAL HIGH (ref 0.4–3.1)

## 2023-10-01 LAB — CBC WITH DIFFERENTIAL/PLATELET
Abs Immature Granulocytes: 0.02 K/uL (ref 0.00–0.07)
Basophils Absolute: 0 K/uL (ref 0.0–0.1)
Basophils Relative: 0 %
Eosinophils Absolute: 0.1 K/uL (ref 0.0–0.5)
Eosinophils Relative: 1 %
HCT: 27.3 % — ABNORMAL LOW (ref 36.0–46.0)
Hemoglobin: 10.2 g/dL — ABNORMAL LOW (ref 12.0–15.0)
Immature Granulocytes: 0 %
Lymphocytes Relative: 35 %
Lymphs Abs: 2 K/uL (ref 0.7–4.0)
MCH: 27.9 pg (ref 26.0–34.0)
MCHC: 37.4 g/dL — ABNORMAL HIGH (ref 30.0–36.0)
MCV: 74.8 fL — ABNORMAL LOW (ref 80.0–100.0)
Monocytes Absolute: 0.4 K/uL (ref 0.1–1.0)
Monocytes Relative: 7 %
Neutro Abs: 3.2 K/uL (ref 1.7–7.7)
Neutrophils Relative %: 57 %
Platelets: 151 K/uL (ref 150–400)
RBC: 3.65 MIL/uL — ABNORMAL LOW (ref 3.87–5.11)
RDW: 15.1 % (ref 11.5–15.5)
WBC: 5.6 K/uL (ref 4.0–10.5)
nRBC: 0 % (ref 0.0–0.2)

## 2023-10-01 LAB — URINALYSIS, DIPSTICK ONLY
Bilirubin Urine: NEGATIVE
Glucose, UA: NEGATIVE mg/dL
Hgb urine dipstick: NEGATIVE
Ketones, ur: NEGATIVE mg/dL
Nitrite: POSITIVE — AB
Protein, ur: NEGATIVE mg/dL
Specific Gravity, Urine: 1.011 (ref 1.005–1.030)
pH: 6 (ref 5.0–8.0)

## 2023-10-01 LAB — COMPREHENSIVE METABOLIC PANEL WITH GFR
ALT: 26 U/L (ref 0–44)
AST: 28 U/L (ref 15–41)
Albumin: 4.2 g/dL (ref 3.5–5.0)
Alkaline Phosphatase: 57 U/L (ref 38–126)
Anion gap: 11 (ref 5–15)
BUN: 10 mg/dL (ref 6–20)
CO2: 20 mmol/L — ABNORMAL LOW (ref 22–32)
Calcium: 8.8 mg/dL — ABNORMAL LOW (ref 8.9–10.3)
Chloride: 109 mmol/L (ref 98–111)
Creatinine, Ser: 0.75 mg/dL (ref 0.44–1.00)
GFR, Estimated: >60 mL/min (ref >60)
Glucose, Bld: 102 mg/dL — ABNORMAL HIGH (ref 70–99)
Potassium: 3.7 mmol/L (ref 3.5–5.1)
Sodium: 140 mmol/L (ref 135–145)
Total Bilirubin: 0.7 mg/dL (ref 0.0–1.2)
Total Protein: 6.5 g/dL (ref 6.5–8.1)

## 2023-10-01 LAB — RAPID URINE DRUG SCREEN, HOSP PERFORMED
Amphetamines: NOT DETECTED
Barbiturates: NOT DETECTED
Benzodiazepines: NOT DETECTED
Cocaine: NOT DETECTED
Opiates: POSITIVE — AB
Tetrahydrocannabinol: POSITIVE — AB

## 2023-10-01 LAB — LACTATE DEHYDROGENASE: LDH: 217 U/L — ABNORMAL HIGH (ref 98–192)

## 2023-10-01 LAB — HCG, SERUM, QUALITATIVE: Preg, Serum: NEGATIVE

## 2023-10-01 LAB — D-DIMER, QUANTITATIVE: D-Dimer, Quant: 0.83 ug{FEU}/mL — ABNORMAL HIGH (ref 0.00–0.50)

## 2023-10-01 LAB — HIV ANTIBODY (ROUTINE TESTING W REFLEX): HIV Screen 4th Generation wRfx: NONREACTIVE

## 2023-10-01 MED ORDER — SODIUM CHLORIDE 0.45 % IV SOLN: INTRAVENOUS | Status: AC

## 2023-10-01 MED ORDER — HYDROMORPHONE HCL 1 MG/ML IJ SOLN
1.0000 mg | INTRAMUSCULAR | Status: AC
  Administered 2023-10-01: 1 mg via INTRAVENOUS
  Filled 2023-10-01: qty 1

## 2023-10-01 MED ORDER — ENOXAPARIN SODIUM 40 MG/0.4ML IJ SOSY
40.0000 mg | PREFILLED_SYRINGE | INTRAMUSCULAR | Status: DC
  Administered 2023-10-01 – 2023-10-02 (×2): 40 mg via SUBCUTANEOUS
  Filled 2023-10-01 (×2): qty 0.4

## 2023-10-01 MED ORDER — HYDROMORPHONE HCL 1 MG/ML IJ SOLN
1.0000 mg | Freq: Once | INTRAMUSCULAR | Status: AC
  Administered 2023-10-01: 1 mg via INTRAVENOUS
  Filled 2023-10-01: qty 1

## 2023-10-01 MED ORDER — NALOXONE HCL 0.4 MG/ML IJ SOLN: 0.4000 mg | INTRAMUSCULAR | Status: DC | PRN

## 2023-10-01 MED ORDER — KETOROLAC TROMETHAMINE 15 MG/ML IJ SOLN
15.0000 mg | INTRAMUSCULAR | Status: AC
  Administered 2023-10-01: 15 mg via INTRAVENOUS
  Filled 2023-10-01: qty 1

## 2023-10-01 MED ORDER — OXYCODONE HCL 5 MG PO TABS
15.0000 mg | ORAL_TABLET | Freq: Four times a day (QID) | ORAL | Status: DC | PRN
  Administered 2023-10-01 – 2023-10-03 (×6): 15 mg via ORAL
  Filled 2023-10-01 (×6): qty 3

## 2023-10-01 MED ORDER — SODIUM CHLORIDE 0.9% FLUSH: 9.0000 mL | INTRAVENOUS | Status: DC | PRN

## 2023-10-01 MED ORDER — DIPHENHYDRAMINE HCL 50 MG/ML IJ SOLN
25.0000 mg | Freq: Once | INTRAMUSCULAR | Status: AC
  Administered 2023-10-01: 25 mg via INTRAVENOUS
  Filled 2023-10-01: qty 1

## 2023-10-01 MED ORDER — POLYETHYLENE GLYCOL 3350 17 G PO PACK: 17.0000 g | PACK | Freq: Every day | ORAL | Status: DC | PRN

## 2023-10-01 MED ORDER — HYDROMORPHONE 1 MG/ML IV SOLN
INTRAVENOUS | Status: DC
  Administered 2023-10-01: 30 mg via INTRAVENOUS
  Administered 2023-10-01 – 2023-10-02 (×2): 2.1 mg via INTRAVENOUS
  Administered 2023-10-02: 0.3 mg via INTRAVENOUS
  Administered 2023-10-02: 3.9 mg via INTRAVENOUS
  Administered 2023-10-02: 1.8 mg via INTRAVENOUS
  Administered 2023-10-02: 5.7 mg via INTRAVENOUS
  Administered 2023-10-02: 1.2 mg via INTRAVENOUS
  Administered 2023-10-03: 1.5 mg via INTRAVENOUS
  Administered 2023-10-03: 0.9 mg via INTRAVENOUS
  Administered 2023-10-03: 0.3 mg via INTRAVENOUS
  Filled 2023-10-01: qty 30

## 2023-10-01 MED ORDER — ACETAMINOPHEN 500 MG PO TABS
1000.0000 mg | ORAL_TABLET | Freq: Once | ORAL | Status: AC
  Administered 2023-10-01: 1000 mg via ORAL
  Filled 2023-10-01: qty 2

## 2023-10-01 MED ORDER — SODIUM CHLORIDE 0.9 % IV BOLUS
1000.0000 mL | Freq: Once | INTRAVENOUS | Status: AC
  Administered 2023-10-01: 1000 mL via INTRAVENOUS

## 2023-10-01 MED ORDER — HYDROMORPHONE HCL 1 MG/ML IJ SOLN: 1.0000 mg | INTRAMUSCULAR | Status: DC | PRN

## 2023-10-01 MED ORDER — KETOROLAC TROMETHAMINE 15 MG/ML IJ SOLN
15.0000 mg | Freq: Four times a day (QID) | INTRAMUSCULAR | Status: DC
  Administered 2023-10-01 – 2023-10-03 (×7): 15 mg via INTRAVENOUS
  Filled 2023-10-01 (×7): qty 1

## 2023-10-01 MED ORDER — ONDANSETRON HCL 4 MG/2ML IJ SOLN
4.0000 mg | Freq: Four times a day (QID) | INTRAMUSCULAR | Status: DC | PRN
  Administered 2023-10-02 – 2023-10-03 (×5): 4 mg via INTRAVENOUS
  Filled 2023-10-01 (×5): qty 2

## 2023-10-01 MED ORDER — SENNOSIDES-DOCUSATE SODIUM 8.6-50 MG PO TABS: 1.0000 | ORAL_TABLET | Freq: Two times a day (BID) | ORAL | Status: DC

## 2023-10-01 MED ORDER — DIPHENHYDRAMINE HCL 25 MG PO CAPS
25.0000 mg | ORAL_CAPSULE | ORAL | Status: DC | PRN
  Administered 2023-10-02 (×3): 25 mg via ORAL
  Filled 2023-10-01 (×3): qty 1

## 2023-10-01 NOTE — Progress Notes (Signed)
 Pt reported to writer that her pain remains uncontrolled. Pt was educated that she must utilize her PCA to aide in decreasing pain. PRN oxycodone  administered per MD orders. Pt also has kpad and other heated packs to assist with her pain. Pt's mother and boyfriend are at the bedside who questioned her pain management. Family educated and pt re-educated on usage of the PCA. As of 2300, pt has utilized PCA 7 times since it was initiated. Pt is awake, alert and oriented but stated she was not using the PCA because I is not working. Again, education of PCA usage was reinforced. Hospitalist was informed of pt's concerns. Pt is tearful but verbalized understanding of education. Call light within reach.

## 2023-10-01 NOTE — H&P (Signed)
 H&P  Patient Demographics:  Annaleia Pence, is a 33 y.o. female  MRN: [id number]   DOB - May 25, 1990  Admit Date - 10/01/2023  Outpatient Primary MD for the patient is Patient, No Pcp Per  Chief Complaint  Patient presents with   Leg Pain   Sickle Cell Pain Crisis      HPI:   Fredna Stricker  is a 33 y.o. female with medical history significant for sickle cell disease (we do not have no record of this history) who presented to the emergency room this afternoon with major complaints of bilateral lower extremity pain that started this morning.  She stated that her pain has been continuous, throbbing and achy, most prominent in her thighs bilaterally.  She denies any history of fall or injury to her extremities.  She denies any tingling sensations or numbness in her lower extremities.  No swelling or redness.  She said this pain is similar to her previous sickle cell crisis.  She is a patient of Dr. Prentice Nephew at [facility] in [place].  She claims she is on oxycodone  15 mg every 4 hours as needed pain at home.  We do not have record of any of these.  Patient is not care everywhere.  ED course:  Vital signs showed: BP 111/81  Pulse (!) 58  Temp 97.6 F (36.4 C)  Resp 20  Ht 5' (1.524 m)  Wt 72.6 kg  SpO2 100%  BMI 31.25 kg/m  CBC showed a normal WBC count, hemoglobin at 10.2, platelet at 151.  Reticulocyte count at 3.5% with an absolute count of 128.  Comprehensive metabolic panel was largely unremarkable.  LDH of 217.  Bilateral lower extremity venous Doppler ultrasound was negative for DVT.  Patient received multiple doses of IV Dilaudid  and other adjunct therapy in the emergency room with no sustained relief of pain.  Patient will be admitted to the hospital for definitive diagnosis of sickle cell disease and further evaluations and management of presumptive sickle cell disease with pain crisis.   Review of systems:  In addition to the HPI above, patient  reports No fever or chills No Headache, No changes with vision or hearing No problems swallowing food or liquids No chest pain, cough or shortness of breath No abdominal pain, No nausea or vomiting, Bowel movements are regular No blood in stool or urine No dysuria No new skin rashes or bruises No new joints pains-aches No new weakness, tingling, numbness in any extremity No recent weight gain or loss No polyuria, polydypsia or polyphagia No significant Mental Stressors  A full 10 point Review of Systems was done, except as stated above, all other Review of Systems were negative.  With Past History of the following :   Past Medical History:  Diagnosis Date   Sickle cell anemia (HCC)       History reviewed. No pertinent surgical history.   Social History:   Social History   Tobacco Use   Smoking status: Never   Smokeless tobacco: Never  Substance Use Topics   Alcohol use: Never     Lives - At home   Family History :   History reviewed. No pertinent family history.   Home Medications:   Prior to Admission medications   Not on File     Allergies:   Allergies  Allergen Reactions   Demerol [Meperidine Hcl]      Physical Exam:   Vitals:   Vitals:   10/01/23 1146 10/01/23  1251  BP: (!) 141/107 111/81  Pulse: 84 (!) 58  Resp: 18 20  Temp: 97.6 F (36.4 C)   SpO2: 100% 100%    Physical Exam: Constitutional: Patient appears well-developed and well-nourished. Not in obvious distress. HENT: Normocephalic, atraumatic, External right and left ear normal. Oropharynx is clear and moist.  Eyes: Conjunctivae and EOM are normal. PERRLA, no scleral icterus. Neck: Normal ROM. Neck supple. No JVD. No tracheal deviation. No thyromegaly. CVS: RRR, S1/S2 +, no murmurs, no gallops, no carotid bruit.  Pulmonary: Effort and breath sounds normal, no stridor, rhonchi, wheezes, rales.  Abdominal: Soft. BS +, no distension, tenderness, rebound or guarding.   Musculoskeletal: Normal range of motion. No edema and no tenderness.  Lymphadenopathy: No lymphadenopathy noted, cervical, inguinal or axillary Neuro: Alert. Normal reflexes, muscle tone coordination. No cranial nerve deficit. Skin: Skin is warm and dry. No rash noted. Not diaphoretic. No erythema. No pallor. Psychiatric: Normal mood and affect. Behavior, judgment, thought content normal.   Data Review:   CBC Recent Labs  Lab 10/01/23 1235  WBC 5.6  HGB 10.2*  HCT 27.3*  PLT 151  MCV 74.8*  MCH 27.9  MCHC 37.4*  RDW 15.1  LYMPHSABS 2.0  MONOABS 0.4  EOSABS 0.1  BASOSABS 0.0   ------------------------------------------------------------------------------------------------------------------  Chemistries  Recent Labs  Lab 10/01/23 1235  NA 140  K 3.7  CL 109  CO2 20*  GLUCOSE 102*  BUN 10  CREATININE 0.75  CALCIUM 8.8*  AST 28  ALT 26  ALKPHOS 57  BILITOT 0.7   ------------------------------------------------------------------------------------------------------------------ estimated creatinine clearance is 88.9 mL/min (by C-G formula based on SCr of 0.75 mg/dL). ------------------------------------------------------------------------------------------------------------------ No results for input(s): TSH, T4TOTAL, T3FREE, THYROIDAB in the last 72 hours.  Invalid input(s): FREET3  Coagulation profile No results for input(s): INR, PROTIME in the last 168 hours. ------------------------------------------------------------------------------------------------------------------- Recent Labs    10/01/23 1223  DDIMER 0.83*   -------------------------------------------------------------------------------------------------------------------  Cardiac Enzymes No results for input(s): CKMB, TROPONINI, MYOGLOBIN in the last 168 hours.  Invalid input(s):  CK ------------------------------------------------------------------------------------------------------------------ No results found for: BNP  ---------------------------------------------------------------------------------------------------------------  Urinalysis No results found for: COLORURINE, APPEARANCEUR, LABSPEC, PHURINE, GLUCOSEU, HGBUR, BILIRUBINUR, KETONESUR, PROTEINUR, UROBILINOGEN, NITRITE, LEUKOCYTESUR  ----------------------------------------------------------------------------------------------------------------   Imaging Results:    US  Venous Img Lower Bilateral Result Date: 10/01/2023 CLINICAL DATA:  Leg pain. Sickle cell anemia. Bilateral lower extremity pain EXAM: Bilateral LOWER EXTREMITY VENOUS DOPPLER ULTRASOUND TECHNIQUE: Gray-scale sonography with compression, as well as color and duplex ultrasound, were performed to evaluate the deep venous system(s) from the level of the common femoral vein through the popliteal and proximal calf veins. COMPARISON:  None Available. FINDINGS: VENOUS Normal compressibility of the common femoral, superficial femoral, and popliteal veins, as well as the visualized calf veins. Visualized portions of profunda femoral vein and great saphenous vein unremarkable. No filling defects to suggest DVT on grayscale or color Doppler imaging. Doppler waveforms show normal direction of venous flow, normal respiratory plasticity and response to augmentation. Limited views of the contralateral common femoral vein are unremarkable. OTHER None. Limitations: none IMPRESSION: No evidence of deep venous thrombosis LEFT or RIGHT lower extremity. Electronically Signed   By: Jackquline Boxer M.D.   On: 10/01/2023 15:41     Assessment & Plan:  Principal Problem:   Sickle cell anemia with crisis (HCC)  Hb Sickle Cell Disease with crisis: Admit patient, start IVF 0.45% Saline @ 125 mls/hour, start weight based Dilaudid  PCA, start IV  Toradol  15 mg Q 6 H, Restart oral home  pain medications of oxycodone  15 mg Q6 hourly as needed pain, Monitor vitals very closely, Re-evaluate pain scale regularly, 2 L of Oxygen by [place], Patient will be re-evaluated for pain in the context of function and relationship to baseline as care progresses. Anemia of Chronic Disease: Hemoglobin appears stable with no clinical indication for blood transfusion at this time.  Will continue to monitor closely and transfuse as appropriate. Chronic pain Syndrome: Restart and continue oral home pain medication as reported by patient.  DVT Prophylaxis: Subcut Lovenox    AM Labs Ordered, also please review Full Orders  Family Communication: Admission, patient's condition and plan of care including tests being ordered have been discussed with the patient who indicate understanding and agree with the plan and Code Status.  Code Status: Full Code  Consults called: None    Admission status: Inpatient    Time spent in minutes : 50 minutes  Jamayia Croker  10/01/2023 at 4:57 PM

## 2023-10-01 NOTE — ED Triage Notes (Signed)
 Pt states bilateral leg  pain, reports aching that started this am  No swelling noted    H/o sickle cell  Been over a year since last crisis

## 2023-10-01 NOTE — ED Provider Notes (Signed)
 [place] [facility] Provider Note   CSN: [id number] Arrival date & time: 10/01/23  1140     Patient presents with: Leg Pain and Sickle Cell Pain Crisis   Stacy [name] is a 33 y.o. female with history of sickle cell disease, presents with concern for bilateral leg pain that started early this morning.  States pain is continuous and most prominent in her thighs bilaterally.  Denies any injuries to her legs.  Denies any numbness or tingling in her legs.  Denies any swelling in her legs, any recent long plane or car rides, recent surgeries or hospitalizations, or any hormonal medication use.  States this pain is somewhat similar to her sickle cell pain, but not completely sure.  She denies any chest pain or shortness of breath.  She follows with Dr. Prentice Nephew with [facility] hematology oncology for her sickle cell.    Leg Pain Sickle Cell Pain Crisis      Prior to Admission medications   Not on File    Allergies: Demerol [meperidine hcl]    Review of Systems  Musculoskeletal:        Leg pain    Updated Vital Signs BP 111/81   Pulse (!) 58   Temp 97.6 F (36.4 C)   Resp 20   Ht 5' (1.524 m)   Wt 72.6 kg   SpO2 100%   BMI 31.25 kg/m   Physical Exam Vitals and nursing note reviewed.  Constitutional:      General: She is not in acute distress.    Appearance: She is well-developed.     Comments: Tearful from pain  HENT:     Head: Normocephalic and atraumatic.  Eyes:     Conjunctiva/sclera: Conjunctivae normal.  Cardiovascular:     Rate and Rhythm: Normal rate and regular rhythm.     Heart sounds: No murmur heard.    Comments: Pedal pulses 2+ bilaterally Pulmonary:     Effort: Pulmonary effort is normal. No respiratory distress.     Breath sounds: Normal breath sounds.  Abdominal:     Palpations: Abdomen is soft.     Tenderness: There is no abdominal tenderness.  Musculoskeletal:        General: No swelling.     Cervical  back: Neck supple.     Right lower leg: No edema.     Left lower leg: No edema.     Comments: Bilateral Lower extremities:  General No obvious deformity. No erythema, edema, contusions, open wounds   Palpation Non tender over the femur, tibia or fibula bilaterally Calves soft and non-tender to palpation bilaterally  ROM Full knee and hip flexion and extension bilaterally.  Full ankle plantarflexion dorsiflexion bilaterally  Sensation: Sensation intact throughout the lower extremity  Strength: 5/5 strength with resisted ankle plantarflexion and dorsiflexion   Skin:    General: Skin is warm and dry.     Capillary Refill: Capillary refill takes less than 2 seconds.  Neurological:     General: No focal deficit present.     Mental Status: She is alert.  Psychiatric:        Mood and Affect: Mood normal.     (all labs ordered are listed, but only abnormal results are displayed) Labs Reviewed  CBC WITH DIFFERENTIAL/PLATELET - Abnormal; Notable for the following components:      Result Value   RBC 3.65 (*)    Hemoglobin 10.2 (*)    HCT 27.3 (*)  MCV 74.8 (*)    MCHC 37.4 (*)    All other components within normal limits  RETICULOCYTES - Abnormal; Notable for the following components:   Retic Ct Pct 3.5 (*)    RBC. 3.64 (*)    Immature Retic Fract 25.1 (*)    All other components within normal limits  COMPREHENSIVE METABOLIC PANEL WITH GFR - Abnormal; Notable for the following components:   CO2 20 (*)    Glucose, Bld 102 (*)    Calcium 8.8 (*)    All other components within normal limits  D-DIMER, QUANTITATIVE - Abnormal; Notable for the following components:   D-Dimer, Quant 0.83 (*)    All other components within normal limits  HCG, SERUM, QUALITATIVE    EKG: None  Radiology: US  Venous Img Lower Bilateral Result Date: 10/01/2023 CLINICAL DATA:  Leg pain. Sickle cell anemia. Bilateral lower extremity pain EXAM: Bilateral LOWER EXTREMITY VENOUS DOPPLER ULTRASOUND  TECHNIQUE: Gray-scale sonography with compression, as well as color and duplex ultrasound, were performed to evaluate the deep venous system(s) from the level of the common femoral vein through the popliteal and proximal calf veins. COMPARISON:  None Available. FINDINGS: VENOUS Normal compressibility of the common femoral, superficial femoral, and popliteal veins, as well as the visualized calf veins. Visualized portions of profunda femoral vein and great saphenous vein unremarkable. No filling defects to suggest DVT on grayscale or color Doppler imaging. Doppler waveforms show normal direction of venous flow, normal respiratory plasticity and response to augmentation. Limited views of the contralateral common femoral vein are unremarkable. OTHER None. Limitations: none IMPRESSION: No evidence of deep venous thrombosis LEFT or RIGHT lower extremity. Electronically Signed   By: Jackquline Boxer M.D.   On: 10/01/2023 15:41     Procedures   Medications Ordered in the ED  HYDROmorphone  (DILAUDID ) injection 1 mg (has no administration in time range)  sodium chloride  0.9 % bolus 1,000 mL (0 mLs Intravenous Stopped 10/01/23 1404)  HYDROmorphone  (DILAUDID ) injection 1 mg (1 mg Intravenous Given 10/01/23 1241)  diphenhydrAMINE  (BENADRYL ) injection 25 mg (25 mg Intravenous Given 10/01/23 1248)  ketorolac  (TORADOL ) 15 MG/ML injection 15 mg (15 mg Intravenous Given 10/01/23 1325)  HYDROmorphone  (DILAUDID ) injection 1 mg (1 mg Intravenous Given 10/01/23 1322)  HYDROmorphone  (DILAUDID ) injection 1 mg (1 mg Intravenous Given 10/01/23 1404)  acetaminophen  (TYLENOL ) tablet 1,000 mg (1,000 mg Oral Given 10/01/23 1326)  HYDROmorphone  (DILAUDID ) injection 1 mg (1 mg Intravenous Given 10/01/23 1612)    Clinical Course as of 10/01/23 1720  Sat Oct 01, 2023  1441 Still reports 10/10 pain after 3 doses of 1mg  dilaudid , fluids, Toradol , and Tylenol  [AF]    Clinical Course User Index [AF] Veta Palma, PA-C                                  Medical Decision Making Amount and/or Complexity of Data Reviewed Labs: ordered.  Risk OTC drugs. Prescription drug management. Decision regarding hospitalization.     Differential diagnosis includes but is not limited to musculoskeletal pain, DVT, sickle cell pain, septic arthritis, fracture, dislocation  ED Course:  Upon initial evaluation, patient is tearful and appears to be in pain, but no acute distress.  Stable vitals.  Reporting pain in the bilateral lower extremities, mostly in the thighs.  Denies any injury, no bony tenderness palpation, low concern for acute fracture or dislocation.  No overlying skin erythema or wounds to suggest cellulitis,  abscess, or other infection.  No edema or swelling of the joints, full range of motion of the bilateral lower extremities, low concern for gout or septic arthritis.  Bilateral lower extremities without edema, given pain started at the same time, less concern for DVT.   Labs Ordered: I Ordered, and personally interpreted labs.  The pertinent results include:   CBC with hemoglobin at 10.2 CMP unremarkable Pregnancy negative D-dimer elevated at 0.83  Imaging Studies ordered: I ordered imaging studies including DVT ultrasound of the bilateral lower extremities I independently visualized the imaging with scope of interpretation limited to determining acute life threatening conditions related to emergency care.  Ultrasound without evidence of DVT in the right or left lower extremity I agree with the radiologist interpretation   Consultations Obtained: I requested consultation with the Hospitalist Dr. Jegede,  and discussed lab and imaging findings as well as pertinent plan - they recommend: admission for further pain control  Medications Given: 1 mg Dilaudid  x 4 NS bolus Tylenol  and Toradol  Benadryl   Upon re-evaluation, patient states she is still in 10 out of 10 pain.  We discussed that her ultrasound did not show any  signs of a blood clot.  Feel symptoms are likely secondary to sickle cell pain flare.  Low concern for any other emergent etiology.  She is in agreement with admission for further pain control at this time.    Impression: Sickle cell pain flare  Disposition:  Admission with hospitalist Dr. Jegede   Record Review: External records from outside source obtained and reviewed including  Looked at chart marked for merge where she was seen for sickle cell by Dr. Bernardino Ill on 07/18/23     This chart was dictated using voice recognition software, Dragon. Despite the best efforts of this provider to proofread and correct errors, errors may still occur which can change documentation meaning.       Final diagnoses:  Sickle cell pain crisis [facility])    ED Discharge Orders     None          Veta Palma, PA-C 10/01/23 1720    Stacy [name] LABOR, DO 10/02/23 1118

## 2023-10-01 NOTE — ED Notes (Signed)
 [place] at bedside

## 2023-10-02 DIAGNOSIS — D5700 Hb-SS disease with crisis, unspecified: Secondary | ICD-10-CM | POA: Diagnosis not present

## 2023-10-02 LAB — CBC
HCT: 25.7 % — ABNORMAL LOW (ref 36.0–46.0)
Hemoglobin: 9.5 g/dL — ABNORMAL LOW (ref 12.0–15.0)
MCH: 28.4 pg (ref 26.0–34.0)
MCHC: 37 g/dL — ABNORMAL HIGH (ref 30.0–36.0)
MCV: 76.7 fL — ABNORMAL LOW (ref 80.0–100.0)
Platelets: 124 K/uL — ABNORMAL LOW (ref 150–400)
RBC: 3.35 MIL/uL — ABNORMAL LOW (ref 3.87–5.11)
RDW: 14.9 % (ref 11.5–15.5)
WBC: 11 K/uL — ABNORMAL HIGH (ref 4.0–10.5)
nRBC: 0 % (ref 0.0–0.2)

## 2023-10-02 MED ORDER — ACETAMINOPHEN 500 MG PO TABS
1000.0000 mg | ORAL_TABLET | Freq: Four times a day (QID) | ORAL | Status: DC | PRN
  Administered 2023-10-02: 1000 mg via ORAL
  Filled 2023-10-02: qty 2

## 2023-10-02 MED ORDER — MELATONIN 5 MG PO TABS
5.0000 mg | ORAL_TABLET | Freq: Once | ORAL | Status: AC
  Administered 2023-10-02: 5 mg via ORAL
  Filled 2023-10-02: qty 1

## 2023-10-02 NOTE — Plan of Care (Signed)

## 2023-10-02 NOTE — Progress Notes (Signed)
 Patient ID: Stacy [name], female   DOB: 07-08-1990, 32 y.o.   MRN: [id number] Subjective: Stacy [name]  is a 33 y.o. female with medical history significant for sickle cell disease who was admitted yesterday for sickle cell pain crisis.  She has no new complaint today, she said she feels slightly better, pain is down to 8/10 from 10/10. She still has pain in her lower extremities and lower back, especially her right leg, characterized as throbbing and achy. No redness or swelling. No fever. No headache. She denies any cough or chest pain or SOB. No nausea or vomiting. No urinary symptom.   Objective:  Vital signs in last 24 hours:  Vitals:   10/02/23 0541 10/02/23 0746 10/02/23 1018 10/02/23 1204  BP: (!) 119/104  121/87   Pulse: (!) 107  71   Resp: 19 16 16 14   Temp: 98 F (36.7 C)  98.1 F (36.7 C)   TempSrc: Oral  Oral   SpO2: 98% 100% 98% 100%  Weight:      Height:        Intake/Output from previous day:   Intake/Output Summary (Last 24 hours) at 10/02/2023 1334 Last data filed at 10/02/2023 0449 Gross per 24 hour  Intake 3232.06 ml  Output --  Net 3232.06 ml    Physical Exam: General: Alert, awake, oriented x3, in no acute distress. Obese HEENT: [place]/AT PEERL, EOMI Neck: Trachea midline,  no masses, no thyromegal,y no JVD, no carotid bruit OROPHARYNX:  Moist, No exudate/ erythema/lesions.  Heart: Regular rate and rhythm, without murmurs, rubs, gallops, PMI non-displaced, no heaves or thrills on palpation.  Lungs: Clear to auscultation, no wheezing or rhonchi noted. No increased vocal fremitus resonant to percussion  Abdomen: Soft, nontender, nondistended, positive bowel sounds, no masses no hepatosplenomegaly noted..  Neuro: No focal neurological deficits noted cranial nerves II through XII grossly intact. DTRs 2+ bilaterally upper and lower extremities. Strength 5 out of 5 in bilateral upper and lower extremities. Musculoskeletal: No warm swelling or erythema around  joints, no spinal tenderness noted. Psychiatric: Patient alert and oriented x3, good insight and cognition, good recent to remote recall. Lymph node survey: No cervical axillary or inguinal lymphadenopathy noted.  Lab Results:  Basic Metabolic Panel:    Component Value Date/Time   NA 140 10/01/2023 1235   K 3.7 10/01/2023 1235   CL 109 10/01/2023 1235   CO2 20 (L) 10/01/2023 1235   BUN 10 10/01/2023 1235   CREATININE 0.75 10/01/2023 1235   GLUCOSE 102 (H) 10/01/2023 1235   CALCIUM 8.8 (L) 10/01/2023 1235   CBC:    Component Value Date/Time   WBC 11.0 (H) 10/02/2023 0545   HGB 9.5 (L) 10/02/2023 0545   HCT 25.7 (L) 10/02/2023 0545   PLT 124 (L) 10/02/2023 0545   MCV 76.7 (L) 10/02/2023 0545   NEUTROABS 3.2 10/01/2023 1235   LYMPHSABS 2.0 10/01/2023 1235   MONOABS 0.4 10/01/2023 1235   EOSABS 0.1 10/01/2023 1235   BASOSABS 0.0 10/01/2023 1235    No results found for this or any previous visit (from the past 240 hours).  Studies/Results: US  Venous Img Lower Bilateral Result Date: 10/01/2023 CLINICAL DATA:  Leg pain. Sickle cell anemia. Bilateral lower extremity pain EXAM: Bilateral LOWER EXTREMITY VENOUS DOPPLER ULTRASOUND TECHNIQUE: Gray-scale sonography with compression, as well as color and duplex ultrasound, were performed to evaluate the deep venous system(s) from the level of the common femoral vein through the popliteal and proximal calf veins. COMPARISON:  None Available.  FINDINGS: VENOUS Normal compressibility of the common femoral, superficial femoral, and popliteal veins, as well as the visualized calf veins. Visualized portions of profunda femoral vein and great saphenous vein unremarkable. No filling defects to suggest DVT on grayscale or color Doppler imaging. Doppler waveforms show normal direction of venous flow, normal respiratory plasticity and response to augmentation. Limited views of the contralateral common femoral vein are unremarkable. OTHER None. Limitations:  none IMPRESSION: No evidence of deep venous thrombosis LEFT or RIGHT lower extremity. Electronically Signed   By: Jackquline Boxer M.D.   On: 10/01/2023 15:41    Medications: Scheduled Meds:  enoxaparin  (LOVENOX ) injection  40 mg Subcutaneous Q24H   HYDROmorphone    Intravenous Q4H   ketorolac   15 mg Intravenous Q6H   senna-docusate  1 tablet Oral BID   Continuous Infusions:  sodium chloride  125 mL/hr at 10/02/23 1038   PRN Meds:.acetaminophen , diphenhydrAMINE , naloxone  **AND** sodium chloride  flush, ondansetron  (ZOFRAN ) IV, oxyCODONE , polyethylene glycol  Consultants: None  Procedures: None  Antibiotics: None  Assessment/Plan: Principal Problem:   Sickle cell anemia with crisis (HCC)  Hb Sickle Cell Disease with Pain crisis: Reduce IVF to 75 mls/hour, continue weight based Dilaudid  PCA at current dose setting, continue IV Toradol  15 mg Q 6 H for a total of 5 days, continue oral home pain medications as ordered. Monitor vitals very closely, Re-evaluate pain scale regularly, 2 L of Oxygen by [place]. Patient encouraged to ambulate on the hallway today.  Leukocytosis: Very mild, most likely due to VOC. No evidence of infection or inflammation, will continue to monitor without antibiotics.  Anemia of Chronic Disease: Hgb is stable at baseline, there is no clinical indication for blood transfusion today. Will continue to monitor closely and transfuse as appropriate. Repeat labs in am. Chronic pain Syndrome: Continue home medications. Add acetaminophen  1 gm Q6H as needed for breakthrough pain.  Code Status: Full Code Family Communication: N/A Disposition Plan: Not yet ready for discharge  Stacy [name]  If 7PM-7AM, please contact night-coverage.  10/02/2023, 1:34 PM  LOS: 1 day

## 2023-10-02 NOTE — Plan of Care (Signed)
   Problem: Education: Goal: Knowledge of General Education information will improve Description: Including pain rating scale, medication(s)/side effects and non-pharmacologic comfort measures Outcome: Progressing   Problem: Clinical Measurements: Goal: Ability to maintain clinical measurements within normal limits will improve Outcome: Progressing   Problem: Activity: Goal: Risk for activity intolerance will decrease Outcome: Progressing   Problem: Nutrition: Goal: Adequate nutrition will be maintained Outcome: Progressing   Problem: Pain Managment: Goal: General experience of comfort will improve and/or be controlled Outcome: Progressing   Problem: Safety: Goal: Ability to remain free from injury will improve Outcome: Progressing   Problem: Skin Integrity: Goal: Risk for impaired skin integrity will decrease Outcome: Progressing

## 2023-10-03 ENCOUNTER — Encounter (HOSPITAL_BASED_OUTPATIENT_CLINIC_OR_DEPARTMENT_OTHER): Payer: Self-pay | Admitting: Emergency Medicine

## 2023-10-03 LAB — URINALYSIS, COMPLETE (UACMP) WITH MICROSCOPIC
Bilirubin Urine: NEGATIVE
Glucose, UA: NEGATIVE mg/dL
Hgb urine dipstick: NEGATIVE
Ketones, ur: NEGATIVE mg/dL
Nitrite: NEGATIVE
Protein, ur: NEGATIVE mg/dL
Specific Gravity, Urine: 1.012 (ref 1.005–1.030)
pH: 6 (ref 5.0–8.0)

## 2023-10-03 LAB — GLUCOSE, CAPILLARY: Glucose-Capillary: 90 mg/dL (ref 70–99)

## 2023-10-03 LAB — HGB FRACTIONATION CASCADE

## 2023-10-03 NOTE — Plan of Care (Signed)
  Problem: Activity: Goal: Risk for activity intolerance will decrease Outcome: Progressing   Problem: Elimination: Goal: Will not experience complications related to bowel motility Outcome: Progressing   Problem: Pain Managment: Goal: General experience of comfort will improve and/or be controlled Outcome: Progressing   Problem: Safety: Goal: Ability to remain free from injury will improve Outcome: Progressing   Problem: Skin Integrity: Goal: Risk for impaired skin integrity will decrease Outcome: Progressing

## 2023-10-03 NOTE — Discharge Summary (Signed)
 Physician Discharge Summary  Stacy [name] FMW:[id number] DOB: 08/24/1990 DOA: 10/01/2023  PCP: Patient, No Pcp Per  Admit date: 10/01/2023  Discharge date: 10/03/2023  Discharge Diagnoses:  Principal Problem:   Sickle cell anemia with crisis [facility])   Discharge Condition: Stable  Disposition:  Pt is discharged home in good condition and is to follow up with Patient, No Pcp Per this week to have labs evaluated. Stacy [name] is instructed to increase activity slowly and balance with rest for the next few days, and use prescribed medication to complete treatment of pain  Diet: Regular Wt Readings from Last 3 Encounters:  10/01/23 72.6 kg    History of present illness:  Stacy [name]  is a 33 y.o. female with medical history significant for sickle cell disease (we do not have no record of this history) who presented to the emergency room with major complaints of bilateral lower extremity pain.  She stated that her pain has been continuous, throbbing and achy, most prominent in her thighs bilaterally.  She denies any history of fall or injury to her extremities.  She denies any tingling sensations or numbness in her lower extremities.  No swelling or redness.  She said this pain is similar to her previous sickle cell crisis.  She is a patient of Dr. Prentice Nephew at [facility] in [place].  She claims she is on oxycodone  15 mg every 4 hours as needed pain at home.  We do not have record of any of these.  Patient is not on care everywhere.   ED course:   Vital signs showed: BP 111/81  Pulse (!) 58  Temp 97.6 F (36.4 C)  Resp 20  Ht 5' (1.524 m)  Wt 72.6 kg  SpO2 100%  BMI 31.25 kg/m  CBC showed a normal WBC count, hemoglobin at 10.2, platelet at 151.  Reticulocyte count at 3.5% with an absolute count of 128.  Comprehensive metabolic panel was largely unremarkable.  LDH of 217.  Bilateral lower extremity venous Doppler ultrasound was negative for DVT.   Patient received multiple doses of IV Dilaudid  and other adjunct therapy in the emergency room with no sustained relief of pain.  Patient was admitted to the hospital for definitive diagnosis of sickle cell disease and further evaluations and management of presumptive sickle cell disease with pain crisis.  Hospital Course:  Patient was admitted for sickle cell pain crisis and managed appropriately with IVF, IV Dilaudid  via PCA and IV Toradol , as well as other adjunct therapies per sickle cell pain management protocols.  Today patient is reporting significant improvement of pain and rates her pain at 3/10.  Patient is ambulating without assistance and tolerating p.o. without nausea or vomiting.  Patient asked to be discharged home home today and to establish with a PCP.  Patient was therefore discharged home today in a hemodynamically stable condition.    Akiva was counseled extensively about nonpharmacologic means of pain management, patient verbalized understanding and was appreciative of  the care received during this admission.   We discussed the need for good hydration, monitoring of hydration status, avoidance of heat, cold, stress, and infection triggers. We discussed the need to be adherent with taking other home medications. Patient was reminded of the need to seek medical attention immediately if any symptom of bleeding, anemia, or infection occurs.  Discharge Exam: Vitals:   10/03/23 0947 10/03/23 1027  BP: 121/70   Pulse: 78   Resp: 14 14  Temp: 98.2 F (  36.8 C)   SpO2: 100%    Vitals:   10/03/23 0641 10/03/23 0732 10/03/23 0947 10/03/23 1027  BP: 100/67  121/70   Pulse: 76  78   Resp: 18 14 14 14   Temp: 98.4 F (36.9 C)  98.2 F (36.8 C)   TempSrc: Oral  Oral   SpO2: 99% 100% 100%   Weight:      Height:        General appearance : Awake, alert, not in any distress. Speech Clear. Not toxic looking HEENT: Atraumatic and Normocephalic, pupils equally reactive to light and  accomodation Neck: Supple, no JVD. No cervical lymphadenopathy.  Chest: Good air entry bilaterally, no added sounds  CVS: S1 S2 regular, no murmurs.  Abdomen: Bowel sounds present, Non tender and not distended with no gaurding, rigidity or rebound. Extremities: B/L Lower Ext shows no edema, both legs are warm to touch Neurology: Awake alert, and oriented X 3, CN II-XII intact, Non focal Skin: No Rash  Discharge Instructions  Discharge Instructions     Call MD for:  severe uncontrolled pain   Complete by: As directed    Call MD for:  temperature >100.4   Complete by: As directed    Diet - low sodium heart healthy   Complete by: As directed    Increase activity slowly   Complete by: As directed       Allergies as of 10/03/2023       Reactions   Demerol [meperidine Hcl]         Medication List     TAKE these medications    oxyCODONE  15 MG immediate release tablet Commonly known as: ROXICODONE  Take 15 mg by mouth every 4 (four) hours as needed.        The results of significant diagnostics from this hospitalization (including imaging, microbiology, ancillary and laboratory) are listed below for reference.    Significant Diagnostic Studies: US  Venous Img Lower Bilateral Result Date: 10/01/2023 CLINICAL DATA:  Leg pain. Sickle cell anemia. Bilateral lower extremity pain EXAM: Bilateral LOWER EXTREMITY VENOUS DOPPLER ULTRASOUND TECHNIQUE: Gray-scale sonography with compression, as well as color and duplex ultrasound, were performed to evaluate the deep venous system(s) from the level of the common femoral vein through the popliteal and proximal calf veins. COMPARISON:  None Available. FINDINGS: VENOUS Normal compressibility of the common femoral, superficial femoral, and popliteal veins, as well as the visualized calf veins. Visualized portions of profunda femoral vein and great saphenous vein unremarkable. No filling defects to suggest DVT on grayscale or color Doppler  imaging. Doppler waveforms show normal direction of venous flow, normal respiratory plasticity and response to augmentation. Limited views of the contralateral common femoral vein are unremarkable. OTHER None. Limitations: none IMPRESSION: No evidence of deep venous thrombosis LEFT or RIGHT lower extremity. Electronically Signed   By: Jackquline Boxer M.D.   On: 10/01/2023 15:41    Microbiology: No results found for this or any previous visit (from the past 240 hours).   Labs: Basic Metabolic Panel: Recent Labs  Lab 10/01/23 1235  NA 140  K 3.7  CL 109  CO2 20*  GLUCOSE 102*  BUN 10  CREATININE 0.75  CALCIUM 8.8*   Liver Function Tests: Recent Labs  Lab 10/01/23 1235  AST 28  ALT 26  ALKPHOS 57  BILITOT 0.7  PROT 6.5  ALBUMIN 4.2   No results for input(s): LIPASE, AMYLASE in the last 168 hours. No results for input(s): AMMONIA in the last 168  hours. CBC: Recent Labs  Lab 10/01/23 1235 10/02/23 0545  WBC 5.6 11.0*  NEUTROABS 3.2  --   HGB 10.2* 9.5*  HCT 27.3* 25.7*  MCV 74.8* 76.7*  PLT 151 124*   Cardiac Enzymes: No results for input(s): CKTOTAL, CKMB, CKMBINDEX, TROPONINI in the last 168 hours. BNP: Invalid input(s): POCBNP CBG: Recent Labs  Lab 10/03/23 0722  GLUCAP 90    Time coordinating discharge: 50 minutes  Signed:  Homer CHRISTELLA Cover NP  10/03/2023, 11:06 AM

## 2023-10-03 NOTE — Progress Notes (Signed)
 Erroneous encounter

## 2023-10-03 NOTE — Progress Notes (Signed)
 AVS reviewed w/ pt who verbalized an understanding- no other questions.PCA disconnected by primary nurse. PIV removed as noted. Pt dressing for d/c to home. Ride here

## 2023-12-11 ENCOUNTER — Encounter (HOSPITAL_BASED_OUTPATIENT_CLINIC_OR_DEPARTMENT_OTHER): Payer: Self-pay | Admitting: Emergency Medicine

## 2023-12-11 ENCOUNTER — Emergency Department (HOSPITAL_BASED_OUTPATIENT_CLINIC_OR_DEPARTMENT_OTHER)
Admission: EM | Admit: 2023-12-11 | Discharge: 2023-12-11 | Disposition: A | Discharge status: Home / Self Care | Attending: Emergency Medicine | Admitting: Emergency Medicine

## 2023-12-11 ENCOUNTER — Other Ambulatory Visit: Payer: Self-pay

## 2023-12-11 DIAGNOSIS — R42 Dizziness and giddiness: Secondary | ICD-10-CM | POA: Diagnosis present

## 2023-12-11 LAB — CBC WITH DIFFERENTIAL/PLATELET
Abs Immature Granulocytes: 0.02 K/uL (ref 0.00–0.07)
Basophils Absolute: 0 K/uL (ref 0.0–0.1)
Basophils Relative: 1 %
Eosinophils Absolute: 0 K/uL (ref 0.0–0.5)
Eosinophils Relative: 1 %
HCT: 31.9 % — ABNORMAL LOW (ref 36.0–46.0)
Hemoglobin: 11.8 g/dL — ABNORMAL LOW (ref 12.0–15.0)
Immature Granulocytes: 0 %
Lymphocytes Relative: 41 %
Lymphs Abs: 2.6 K/uL (ref 0.7–4.0)
MCH: 27.6 pg (ref 26.0–34.0)
MCHC: 37 g/dL — ABNORMAL HIGH (ref 30.0–36.0)
MCV: 74.7 fL — ABNORMAL LOW (ref 80.0–100.0)
Monocytes Absolute: 0.4 K/uL (ref 0.1–1.0)
Monocytes Relative: 7 %
Neutro Abs: 3.2 K/uL (ref 1.7–7.7)
Neutrophils Relative %: 50 %
Platelets: 183 K/uL (ref 150–400)
RBC: 4.27 MIL/uL (ref 3.87–5.11)
RDW: 15.1 % (ref 11.5–15.5)
WBC: 6.3 K/uL (ref 4.0–10.5)
nRBC: 0 % (ref 0.0–0.2)

## 2023-12-11 LAB — COMPREHENSIVE METABOLIC PANEL WITH GFR
ALT: 25 U/L (ref 0–44)
AST: 27 U/L (ref 15–41)
Albumin: 4.7 g/dL (ref 3.5–5.0)
Alkaline Phosphatase: 58 U/L (ref 38–126)
Anion gap: 12 (ref 5–15)
BUN: 10 mg/dL (ref 6–20)
CO2: 22 mmol/L (ref 22–32)
Calcium: 9.4 mg/dL (ref 8.9–10.3)
Chloride: 106 mmol/L (ref 98–111)
Creatinine, Ser: 0.77 mg/dL (ref 0.44–1.00)
GFR, Estimated: >60 mL/min (ref >60)
Glucose, Bld: 92 mg/dL (ref 70–99)
Potassium: 3.7 mmol/L (ref 3.5–5.1)
Sodium: 141 mmol/L (ref 135–145)
Total Bilirubin: 1 mg/dL (ref 0.0–1.2)
Total Protein: 7.5 g/dL (ref 6.5–8.1)

## 2023-12-11 LAB — RESP PANEL BY RT-PCR (RSV, FLU A&B, COVID)  RVPGX2
Influenza A by PCR: NEGATIVE
Influenza B by PCR: NEGATIVE
Resp Syncytial Virus by PCR: NEGATIVE
SARS Coronavirus 2 by RT PCR: NEGATIVE

## 2023-12-11 LAB — CBG MONITORING, ED: Glucose-Capillary: 81 mg/dL (ref 70–99)

## 2023-12-11 MED ORDER — MECLIZINE HCL 25 MG PO TABS: 25.0000 mg | ORAL_TABLET | Freq: Three times a day (TID) | ORAL | 0 refills | Status: AC | PRN

## 2023-12-11 MED ORDER — MECLIZINE HCL 25 MG PO TABS
25.0000 mg | ORAL_TABLET | Freq: Once | ORAL | Status: AC
  Administered 2023-12-11: 25 mg via ORAL
  Filled 2023-12-11: qty 1

## 2023-12-11 MED ORDER — ONDANSETRON 4 MG PO TBDP: 4.0000 mg | ORAL_TABLET | Freq: Three times a day (TID) | ORAL | 0 refills | Status: AC | PRN

## 2023-12-11 MED ORDER — DIAZEPAM 5 MG/ML IJ SOLN
5.0000 mg | Freq: Once | INTRAMUSCULAR | Status: AC
  Administered 2023-12-11: 5 mg via INTRAVENOUS
  Filled 2023-12-11: qty 2

## 2023-12-11 MED ORDER — SODIUM CHLORIDE 0.9 % IV BOLUS
1000.0000 mL | Freq: Once | INTRAVENOUS | Status: AC
  Administered 2023-12-11: 1000 mL via INTRAVENOUS

## 2023-12-11 NOTE — ED Provider Notes (Addendum)
 [place] [facility] Provider Note   CSN: [id number] Arrival date & time: 12/11/23  1812     Patient presents with: Dizziness   Stacy [name] is a 33 y.o. female.    Dizziness   33 year old female presents emergency department complaints of dizziness.  Describes it as room spinning sensation.  States that she has had this since yesterday.  States it is worsened with positional changes as well as whenever she moves her eyes around the room.  Denies any headache.  Denies any visual disturbance, gait abnormality, difficulty swallowing, weakness/sensory deficit of extremities, slurred speech, facial droop.  Does report history of similar symptoms in the past that improved with medications that she took at home.  Presents emergency department for further assessment.  Denies any chest pain, shortness of breath, exertional worsening symptoms.  Past medical history significant for sickle cell anemia, renal insufficiency, mild, collagen vascular disease  Prior to Admission medications   Medication Sig Start Date End Date Taking? Authorizing Provider  meclizine  (ANTIVERT ) 25 MG tablet Take 1 tablet (25 mg total) by mouth 3 (three) times daily as needed for dizziness. 12/11/23  Yes Silver Fell A, PA  ondansetron  (ZOFRAN -ODT) 4 MG disintegrating tablet Take 1 tablet (4 mg total) by mouth every 8 (eight) hours as needed. 12/11/23  Yes Silver Fell A, PA  acetaminophen  (TYLENOL ) 500 MG tablet Take 1 tablet (500 mg total) by mouth every 6 (six) hours as needed. 11/12/19   Fawze, Mina A, PA-C  benzonatate  (TESSALON ) 100 MG capsule Take 1 capsule (100 mg total) by mouth 3 (three) times daily as needed for cough. 11/12/19   Fawze, Mina A, PA-C  fluticasone  (FLONASE ) 50 MCG/ACT nasal spray Place 2 sprays into both nostrils daily. 11/12/19   Fawze, Mina A, PA-C  metroNIDAZOLE  (FLAGYL ) 500 MG tablet Take 1 tablet (500 mg total) by mouth 2 (two) times daily. 01/08/21    Towana Ozell BROCKS, MD  ondansetron  (ZOFRAN ) 4 MG tablet Take 1 tablet (4 mg total) by mouth every 8 (eight) hours as needed for nausea or vomiting. 11/12/19   Meryle Ip A, PA-C  oseltamivir  (TAMIFLU ) 75 MG capsule Take 1 capsule (75 mg total) by mouth every 12 (twelve) hours. 05/01/23   Mannie Pac T, DO  oxyCODONE  (ROXICODONE ) 15 MG immediate release tablet Take 1 tablet (15 mg total) by mouth every 4 (four) hours as needed for pain. 09/02/17   Jegede, Olugbemiga E, MD  oxyCODONE  (ROXICODONE ) 15 MG immediate release tablet Take 15 mg by mouth every 4 (four) hours as needed. 09/12/23   [provider]  potassium chloride  SA (KLOR-CON ) 20 MEQ tablet Take 20 mEq by mouth daily. 12/11/20   [provider]    Allergies: Demerol [meperidine hcl] and Demerol [meperidine]    Review of Systems  Neurological:  Positive for dizziness.  All other systems reviewed and are negative.   Updated Vital Signs BP 111/71   Pulse (!) 54   Temp 98.7 F (37.1 C) (Oral)   Resp 17   Ht 5' (1.524 m)   Wt 72.6 kg   SpO2 100%   BMI 31.26 kg/m   Physical Exam Vitals and nursing note reviewed.  Constitutional:      General: She is not in acute distress.    Appearance: She is well-developed.  HENT:     Head: Normocephalic and atraumatic.  Eyes:     Conjunctiva/sclera: Conjunctivae normal.  Cardiovascular:     Rate and  Rhythm: Normal rate and regular rhythm.     Heart sounds: No murmur heard. Pulmonary:     Effort: Pulmonary effort is normal. No respiratory distress.     Breath sounds: Normal breath sounds. No wheezing, rhonchi or rales.  Abdominal:     Palpations: Abdomen is soft.     Tenderness: There is no abdominal tenderness.  Musculoskeletal:        General: No swelling.     Cervical back: Neck supple.  Skin:    General: Skin is warm and dry.     Capillary Refill: Capillary refill takes less than 2 seconds.  Neurological:     Mental Status: She is alert.     Comments:  Alert and oriented to self, place, time and event.   Speech is fluent, clear without dysarthria or dysphasia.   Strength 5/5 in upper/lower extremities   Sensation intact in upper/lower extremities   Able to ambulate independently without obvious gait deviation. Negative Romberg. No pronator drift.  Normal finger-to-nose and feet tapping.  CN I not tested  CN II not tested CN III, IV, VI PERRLA and EOMs intact bilaterally.  Rightward beating horizontal nystagmus on exam.  Nystagmus fatigable. CN V Intact sensation to sharp and light touch to the face  CN VII facial movements symmetric  CN VIII not tested  CN IX, X no uvula deviation, symmetric rise of soft palate  CN XI 5/5 SCM and trapezius strength bilaterally  CN XII Midline tongue protrusion, symmetric L/R movements     Psychiatric:        Mood and Affect: Mood normal.     (all labs ordered are listed, but only abnormal results are displayed) Labs Reviewed  CBC WITH DIFFERENTIAL/PLATELET - Abnormal; Notable for the following components:      Result Value   Hemoglobin 11.8 (*)    HCT 31.9 (*)    MCV 74.7 (*)    MCHC 37.0 (*)    All other components within normal limits  RESP PANEL BY RT-PCR (RSV, FLU A&B, COVID)  RVPGX2  COMPREHENSIVE METABOLIC PANEL WITH GFR  URINALYSIS, ROUTINE W REFLEX MICROSCOPIC  PREGNANCY, URINE  CBG MONITORING, ED    EKG: None  Radiology: No results found.   Procedures   Medications Ordered in the ED  sodium chloride  0.9 % bolus 1,000 mL (0 mLs Intravenous Stopped 12/11/23 2139)  meclizine  (ANTIVERT ) tablet 25 mg (25 mg Oral Given 12/11/23 2038)  diazepam  (VALIUM ) injection 5 mg (5 mg Intravenous Given 12/11/23 2040)                                    Medical Decision Making Amount and/or Complexity of Data Reviewed Labs: ordered.  Risk Prescription drug management.   This patient presents to the ED for concern of dizziness, this involves an extensive number of treatment  options, and is a complaint that carries with it a high risk of complications and morbidity.  The differential diagnosis includes CVA, cerebral venous thrombosis, vertebrobasilar insufficiency, PE, arrhythmia, dehydration, medication side effect other   Co morbidities that complicate the patient evaluation  See HPI   Additional history obtained:  Additional history obtained from EMR External records from outside source obtained and reviewed including hospital records   Lab Tests:  I Ordered, and personally interpreted labs.  The pertinent results include: No leukocytosis.  Anemia hemoglobin 11.8 microcytic in nature.  Platelets within range.  No Electra abnormalities.  No transaminitis.  No renal dysfunction.  Viral testing negative.  CBG within normal limits.   Imaging Studies ordered:  N/a   Cardiac Monitoring: / EKG:  The patient was maintained on a cardiac monitor.  I personally viewed and interpreted the cardiac monitored which showed an underlying rhythm of: Sinus rhythm.  T wave inversion V1.  No acute ischemic change from prior EKG performed.   Consultations Obtained:  N/a   Problem List / ED Course / Critical interventions / Medication management  Dizziness I ordered medication including normal saline, meclizine , diazepam    Reevaluation of the patient after these medicines showed that the patient improved I have reviewed the patients home medicines and have made adjustments as needed   Social Determinants of Health:  Denies tobacco or illicit drug use.   Test / Admission - Considered:  Dizziness Vitals signs within normal range and stable throughout visit. Laboratory studies significant for: see above  33 year old female presents emergency department complaints of dizziness.  Describes it as room spinning sensation.  States that she has had this since yesterday.  States it is worsened with positional changes as well as whenever she moves her eyes around  the room.  Denies any headache.  Denies any visual disturbance, gait abnormality, difficulty swallowing, weakness/sensory deficit of extremities, slurred speech, facial droop.  Does report history of similar symptoms in the past that improved with medications that she took at home.  Presents emergency department for further assessment.  Denies any chest pain, shortness of breath, exertional worsening symptoms. On exam, nonfocal neurologic exam.  Rectal bleeding with horizontal nystagmus.  Lungs clear to auscultation bilaterally.  No abdominal tenderness.  Labs without acute emergent process.  EKG nonischemic compared to prior EKGs without obvious arrhythmia.  Per history as well as clinical exam, patient's room spinning dizziness most consistent with peripheral vertigo; low suspicion for central process.  Treated with medications and noted significant improvement reaching near resolution.  Will treat patient's symptoms at home with meclizine  as needed.  Recommend follow-up with ENT in the outpatient setting.  Treatment plan discussed with patient treatment understanding was agreeable.  Patient well-appearing, afebrile in no acute distress upon discharge. Worrisome signs and symptoms were discussed with the patient, and the patient acknowledged understanding to return to the ED if noticed. Patient was stable upon discharge.       Final diagnoses:  Dizziness    ED Discharge Orders          Ordered    meclizine  (ANTIVERT ) 25 MG tablet  3 times daily PRN        12/11/23 2134    ondansetron  (ZOFRAN -ODT) 4 MG disintegrating tablet  Every 8 hours PRN        12/11/23 2134                Silver Wonda LABOR, [place] 12/11/23 2203    Mannie Pac T, DO 12/11/23 2313

## 2023-12-11 NOTE — ED Notes (Addendum)
 Denies pain. Patient reports dizziness has improved.

## 2023-12-11 NOTE — ED Notes (Signed)
 Pt aware a UA is needed. Specimen cup given

## 2023-12-11 NOTE — ED Triage Notes (Signed)
 Pt c/o dizziness since yesterday; felt near syncopal at one point yesterday; dizziness improves somewhat when she closes her eyes

## 2023-12-11 NOTE — Discharge Instructions (Addendum)
 Your workup today was reassuring.  Suspect that your symptoms are most likely secondary to vertigo.  Will send you with medicine to take as needed for dizziness/room spinning sensation.  Also send you home with medicine to use as needed for nausea.  Recommend oral hydration at home.  Recommend follow-up with primary care for reassessment.

## 2023-12-11 NOTE — ED Notes (Signed)
 ED Provider at bedside Corlis PA). Patient reassessed and updates provided. Family remains at bedside.
